# Patient Record
Sex: Male | Born: 1945
Health system: Southern US, Community
[De-identification: ages and names within clinical notes are randomized; demographics above are authoritative.]

## PROBLEM LIST (undated history)

## (undated) DIAGNOSIS — I252 Old myocardial infarction: Secondary | ICD-10-CM

## (undated) DIAGNOSIS — I219 Acute myocardial infarction, unspecified: Secondary | ICD-10-CM

## (undated) DIAGNOSIS — I10 Essential (primary) hypertension: Secondary | ICD-10-CM

## (undated) DIAGNOSIS — H409 Unspecified glaucoma: Secondary | ICD-10-CM

## (undated) DIAGNOSIS — I2510 Atherosclerotic heart disease of native coronary artery without angina pectoris: Secondary | ICD-10-CM

## (undated) HISTORY — PX: CORONARY STENT PLACEMENT: SHX1402

## (undated) HISTORY — PX: OTHER SURGICAL HISTORY: SHX169

---

## 1997-09-14 ENCOUNTER — Emergency Department (HOSPITAL_COMMUNITY): Admission: EM | Admit: 1997-09-14 | Discharge: 1997-09-14 | Payer: Self-pay | Admitting: Emergency Medicine

## 2005-07-22 ENCOUNTER — Inpatient Hospital Stay (HOSPITAL_COMMUNITY): Admission: EM | Admit: 2005-07-22 | Discharge: 2005-07-25 | Payer: Self-pay | Admitting: Emergency Medicine

## 2005-07-24 ENCOUNTER — Encounter (INDEPENDENT_AMBULATORY_CARE_PROVIDER_SITE_OTHER): Payer: Self-pay | Admitting: *Deleted

## 2005-08-06 ENCOUNTER — Encounter (HOSPITAL_COMMUNITY): Admission: RE | Admit: 2005-08-06 | Discharge: 2005-11-04 | Payer: Self-pay | Admitting: Cardiology

## 2005-11-05 ENCOUNTER — Encounter (HOSPITAL_COMMUNITY): Admission: RE | Admit: 2005-11-05 | Discharge: 2005-12-25 | Payer: Self-pay | Admitting: Cardiology

## 2006-01-26 ENCOUNTER — Emergency Department (HOSPITAL_COMMUNITY): Admission: EM | Admit: 2006-01-26 | Discharge: 2006-01-26 | Payer: Self-pay | Admitting: Emergency Medicine

## 2006-07-07 ENCOUNTER — Ambulatory Visit: Payer: Self-pay | Admitting: Cardiology

## 2006-11-04 ENCOUNTER — Ambulatory Visit: Payer: Self-pay | Admitting: Cardiology

## 2007-03-08 ENCOUNTER — Ambulatory Visit: Payer: Self-pay | Admitting: Cardiology

## 2007-07-20 ENCOUNTER — Ambulatory Visit: Payer: Self-pay | Admitting: Cardiology

## 2007-08-18 ENCOUNTER — Ambulatory Visit: Payer: Self-pay | Admitting: Cardiology

## 2007-08-18 LAB — CONVERTED CEMR LAB
BUN: 19 mg/dL (ref 6–23)
CO2: 29 meq/L (ref 19–32)
Calcium: 9.2 mg/dL (ref 8.4–10.5)
Creatinine, Ser: 1.1 mg/dL (ref 0.4–1.5)
GFR calc Af Amer: 87 mL/min
Glucose, Bld: 95 mg/dL (ref 70–99)

## 2007-11-08 ENCOUNTER — Ambulatory Visit: Payer: Self-pay | Admitting: Cardiology

## 2008-03-01 ENCOUNTER — Ambulatory Visit: Payer: Self-pay | Admitting: Cardiology

## 2008-07-10 DIAGNOSIS — R011 Cardiac murmur, unspecified: Secondary | ICD-10-CM

## 2008-07-10 DIAGNOSIS — I2510 Atherosclerotic heart disease of native coronary artery without angina pectoris: Secondary | ICD-10-CM | POA: Insufficient documentation

## 2008-07-10 DIAGNOSIS — E785 Hyperlipidemia, unspecified: Secondary | ICD-10-CM | POA: Insufficient documentation

## 2008-07-10 DIAGNOSIS — I10 Essential (primary) hypertension: Secondary | ICD-10-CM | POA: Insufficient documentation

## 2008-07-11 ENCOUNTER — Ambulatory Visit: Payer: Self-pay | Admitting: Cardiovascular Disease

## 2008-07-11 ENCOUNTER — Ambulatory Visit: Payer: Self-pay | Admitting: Cardiology

## 2008-11-22 ENCOUNTER — Ambulatory Visit: Payer: Self-pay | Admitting: Cardiology

## 2009-03-01 ENCOUNTER — Encounter (INDEPENDENT_AMBULATORY_CARE_PROVIDER_SITE_OTHER): Payer: Self-pay | Admitting: *Deleted

## 2009-03-19 ENCOUNTER — Ambulatory Visit: Payer: Self-pay | Admitting: Cardiology

## 2009-07-25 ENCOUNTER — Inpatient Hospital Stay (HOSPITAL_COMMUNITY): Admission: EM | Admit: 2009-07-25 | Discharge: 2009-08-03 | Payer: Self-pay | Admitting: Emergency Medicine

## 2009-07-26 ENCOUNTER — Encounter (INDEPENDENT_AMBULATORY_CARE_PROVIDER_SITE_OTHER): Payer: Self-pay | Admitting: Internal Medicine

## 2009-07-29 ENCOUNTER — Ambulatory Visit: Payer: Self-pay | Admitting: Infectious Diseases

## 2009-10-10 ENCOUNTER — Ambulatory Visit: Payer: Self-pay | Admitting: Cardiology

## 2009-11-22 ENCOUNTER — Ambulatory Visit: Payer: Self-pay | Admitting: Cardiology

## 2010-03-11 ENCOUNTER — Ambulatory Visit: Payer: Self-pay | Admitting: Cardiology

## 2010-03-18 ENCOUNTER — Ambulatory Visit: Payer: Self-pay | Admitting: Cardiology

## 2010-05-08 ENCOUNTER — Inpatient Hospital Stay (HOSPITAL_BASED_OUTPATIENT_CLINIC_OR_DEPARTMENT_OTHER)
Admission: RE | Admit: 2010-05-08 | Discharge: 2010-05-08 | Disposition: A | Discharge status: Home / Self Care | Payer: Medicare Other | Source: Ambulatory Visit | Attending: Interventional Cardiology | Admitting: Interventional Cardiology

## 2010-05-08 DIAGNOSIS — R9439 Abnormal result of other cardiovascular function study: Secondary | ICD-10-CM | POA: Insufficient documentation

## 2010-05-08 DIAGNOSIS — I2510 Atherosclerotic heart disease of native coronary artery without angina pectoris: Secondary | ICD-10-CM | POA: Insufficient documentation

## 2010-05-08 DIAGNOSIS — R0602 Shortness of breath: Secondary | ICD-10-CM | POA: Insufficient documentation

## 2010-05-08 DIAGNOSIS — Z9861 Coronary angioplasty status: Secondary | ICD-10-CM | POA: Insufficient documentation

## 2010-05-14 NOTE — Procedures (Signed)
NAME:  Mario [name], Mario [name] NO.:  [id number]  MEDICAL RECORD NO.:  [id number]            PATIENT TYPE:  LOCATION:                                 FACILITY:  PHYSICIAN:  Corky Crafts, MD     DATE OF BIRTH:  DATE OF PROCEDURE:  05/08/2010 DATE OF DISCHARGE:                           CARDIAC CATHETERIZATION   PROCEDURES PERFORMED: 1. Left heart catheterization. 2. Coronary angiogram. 3. Abdominal aortogram. 4. eft ventriculogram.  OPERATOR:  Corky Crafts, MD  INDICATIONS:  Abnormal stress test, persistent shortness of breath.  PROCEDURE NARRATIVE:  The risks and benefits of cardiac catheterization were explained to the patient.  Informed consent was obtained.  He was brought to the cath lab.  He was prepped and draped in usual sterile fashion.  His right groin was infiltrated with 1% lidocaine.  A 4-French sheath was placed in the right common femoral artery using modified Seldinger technique.  Left coronary artery angiography was performed using a JL-4.0 catheter.  Catheter was advanced to the vessel ostium under fluoroscopic guidance.  Digital angiography was performed in multiple projections using injection contrast.  Right coronary artery angiography was then performed using a JR-4.0 catheter in a similar fashion.  Catheter was advanced to the ascending aorta and across the aortic valve under fluoroscopic guidance.  Power injection of contrast performed in the RAO projection to image the left ventricle.  The catheter was pulled back under continuous hemodynamic pressure monitoring.  Catheter was then withdrawn through the abdominal aorta and a power injection of contrast was performed in AP projection.  The sheath was removed using manual compression.  FINDINGS:  The left main is widely patent.  Left circumflex is a large vessel.  The OM-1 is large.  The OM-2 is medium-sized.  The entire circumflex system appears  angiographically normal.  Left anterior descending has an ostial 25% stenosis.  There is a stent in the mid LAD, which appears patent.  There is approximately 10% restenosis in the midportion of the stent.  The remainder of the LAD is medium sized in the midportion and then small in the distal portion. The vessel does not wrap around the apex.  There are multiple small diagonals present all of which are patent.  The right coronary artery is a large dominant vessel, which appears angiographically normal.  The PDA reaches the apex.  The posterolateral artery is a medium-sized vessel.  Left ventriculogram shows normal ventricular function with an estimated ejection fraction of 60%.  HEMODYNAMIC RESULTS:  Left ventricular pressure 120/40 with an LVEDP of 15 mmHg.  Aortic pressure 120/69 with a mean aortic pressure of 92 mmHg.  The abdominal aortogram shows no abdominal aortic aneurysm.  There is no renal artery stenosis or bilateral single renal artery.  IMPRESSION: 1. Patent left anterior descending stent moderate atherosclerosis in     the left anterior descending when comparing these pictures to the     prior films.  The ostial left anterior descending disease appears     stable. 2. Normal left ventricular function. 3. No abdominal aortic aneurysm or renal artery stenosis.  4. Normal hemodynamics.  RECOMMENDATIONS:  Continue medical therapy.  The patient should try to increase his exercise regimen as he was planning to do.     Corky Crafts, MD     JSV/MEDQ  D:  05/08/2010  T:  05/09/2010  Job:  [id number]  Electronically Signed by Lance Muss MD on 05/14/2010 09:51:16 AM

## 2010-06-17 LAB — COMPREHENSIVE METABOLIC PANEL
ALT: 26 U/L (ref 0–53)
ALT: 13 U/L (ref 0–53)
AST: 39 U/L — ABNORMAL HIGH (ref 0–37)
Albumin: 2.7 g/dL — ABNORMAL LOW (ref 3.5–5.2)
Alkaline Phosphatase: 36 U/L — ABNORMAL LOW (ref 39–117)
CO2: 25 mEq/L (ref 19–32)
Calcium: 8.3 mg/dL — ABNORMAL LOW (ref 8.4–10.5)
Calcium: 7.5 mg/dL — ABNORMAL LOW (ref 8.4–10.5)
Creatinine, Ser: 1.04 mg/dL (ref 0.4–1.5)
GFR calc Af Amer: >60 mL/min (ref >60)
GFR calc non Af Amer: >60 mL/min (ref >60)
Potassium: 4.4 mEq/L (ref 3.5–5.1)
Sodium: 142 mEq/L (ref 135–145)
Sodium: 135 mEq/L (ref 135–145)
Total Protein: 6.2 g/dL (ref 6.0–8.3)
Total Protein: 5.2 g/dL — ABNORMAL LOW (ref 6.0–8.3)

## 2010-06-17 LAB — CBC
HCT: 33.1 % — ABNORMAL LOW (ref 39.0–52.0)
HCT: 35.5 % — ABNORMAL LOW (ref 39.0–52.0)
Hemoglobin: 11.6 g/dL — ABNORMAL LOW (ref 13.0–17.0)
Hemoglobin: 12.3 g/dL — ABNORMAL LOW (ref 13.0–17.0)
Hemoglobin: 12.4 g/dL — ABNORMAL LOW (ref 13.0–17.0)
MCHC: 33.8 g/dL (ref 30.0–36.0)
MCHC: 34.3 g/dL (ref 30.0–36.0)
MCHC: 34.3 g/dL (ref 30.0–36.0)
MCHC: 34.4 g/dL (ref 30.0–36.0)
MCHC: 35 g/dL (ref 30.0–36.0)
MCV: 88.7 fL (ref 78.0–100.0)
MCV: 89 fL (ref 78.0–100.0)
MCV: 89 fL (ref 78.0–100.0)
MCV: 89.1 fL (ref 78.0–100.0)
MCV: 89.3 fL (ref 78.0–100.0)
MCV: 89.5 fL (ref 78.0–100.0)
MCV: 89.9 fL (ref 78.0–100.0)
MCV: 89.9 fL (ref 78.0–100.0)
Platelets: 202 10*3/uL (ref 150–400)
Platelets: 220 10*3/uL (ref 150–400)
Platelets: 287 10*3/uL (ref 150–400)
Platelets: 157 10*3/uL (ref 150–400)
Platelets: 97 10*3/uL — ABNORMAL LOW (ref 150–400)
Platelets: DECREASED 10*3/uL (ref 150–400)
RBC: 4.05 MIL/uL — ABNORMAL LOW (ref 4.22–5.81)
RBC: 4.07 MIL/uL — ABNORMAL LOW (ref 4.22–5.81)
RBC: 4.13 MIL/uL — ABNORMAL LOW (ref 4.22–5.81)
RBC: 3.66 MIL/uL — ABNORMAL LOW (ref 4.22–5.81)
RDW: 13.4 % (ref 11.5–15.5)
RDW: 13.6 % (ref 11.5–15.5)
RDW: 13.4 % (ref 11.5–15.5)
RDW: 13.5 % (ref 11.5–15.5)
WBC: 11.8 10*3/uL — ABNORMAL HIGH (ref 4.0–10.5)
WBC: 12.8 10*3/uL — ABNORMAL HIGH (ref 4.0–10.5)
WBC: 13.6 10*3/uL — ABNORMAL HIGH (ref 4.0–10.5)
WBC: 14 10*3/uL — ABNORMAL HIGH (ref 4.0–10.5)

## 2010-06-17 LAB — BASIC METABOLIC PANEL
BUN: 10 mg/dL (ref 6–23)
BUN: 10 mg/dL (ref 6–23)
BUN: 11 mg/dL (ref 6–23)
BUN: 14 mg/dL (ref 6–23)
CO2: 25 mEq/L (ref 19–32)
Calcium: 8.5 mg/dL (ref 8.4–10.5)
Calcium: 7.9 mg/dL — ABNORMAL LOW (ref 8.4–10.5)
Chloride: 106 mEq/L (ref 96–112)
Chloride: 107 mEq/L (ref 96–112)
Chloride: 109 mEq/L (ref 96–112)
Chloride: 114 mEq/L — ABNORMAL HIGH (ref 96–112)
Creatinine, Ser: 1.04 mg/dL (ref 0.4–1.5)
Creatinine, Ser: 1.16 mg/dL (ref 0.4–1.5)
Creatinine, Ser: 1.18 mg/dL (ref 0.4–1.5)
Creatinine, Ser: 1.16 mg/dL (ref 0.4–1.5)
Creatinine, Ser: 1.21 mg/dL (ref 0.4–1.5)
GFR calc Af Amer: >60 mL/min (ref >60)
GFR calc Af Amer: >60 mL/min (ref >60)
GFR calc Af Amer: >60 mL/min (ref >60)
GFR calc Af Amer: >60 mL/min (ref >60)
GFR calc non Af Amer: >60 mL/min (ref >60)
GFR calc non Af Amer: >60 mL/min (ref >60)
GFR calc non Af Amer: >60 mL/min (ref >60)
Glucose, Bld: 85 mg/dL (ref 70–99)
Glucose, Bld: 89 mg/dL (ref 70–99)
Glucose, Bld: 96 mg/dL (ref 70–99)
Potassium: 3.4 mEq/L — ABNORMAL LOW (ref 3.5–5.1)
Potassium: 3.8 mEq/L (ref 3.5–5.1)
Sodium: 140 mEq/L (ref 135–145)
Sodium: 140 mEq/L (ref 135–145)

## 2010-06-17 LAB — POCT I-STAT, CHEM 8
BUN: 15 mg/dL (ref 6–23)
Creatinine, Ser: 1.2 mg/dL (ref 0.4–1.5)
Hemoglobin: 14.6 g/dL (ref 13.0–17.0)
Potassium: 3.7 mEq/L (ref 3.5–5.1)
Sodium: 139 mEq/L (ref 135–145)

## 2010-06-17 LAB — DIFFERENTIAL
Basophils Relative: 0 % (ref 0–1)
Eosinophils Absolute: 0 10*3/uL (ref 0.0–0.7)
Eosinophils Absolute: 0 10*3/uL (ref 0.0–0.7)
Eosinophils Relative: 0 % (ref 0–5)
Lymphocytes Relative: 12 % (ref 12–46)
Lymphocytes Relative: 8 % — ABNORMAL LOW (ref 12–46)
Lymphs Abs: 1.7 10*3/uL (ref 0.7–4.0)
Neutro Abs: 11.4 10*3/uL — ABNORMAL HIGH (ref 1.7–7.7)
Neutro Abs: 18.4 10*3/uL — ABNORMAL HIGH (ref 1.7–7.7)
Neutrophils Relative %: 81 % — ABNORMAL HIGH (ref 43–77)

## 2010-06-17 LAB — AFB CULTURE WITH SMEAR (NOT AT ARMC): Acid Fast Smear: NONE SEEN

## 2010-06-17 LAB — SEDIMENTATION RATE: Sed Rate: 3 mm/hr (ref 0–16)

## 2010-06-17 LAB — WOUND CULTURE: Gram Stain: NONE SEEN

## 2010-06-17 LAB — CULTURE, BLOOD (ROUTINE X 2)
Culture: NO GROWTH
Culture: NO GROWTH

## 2010-06-17 LAB — HEPATIC FUNCTION PANEL
AST: 22 U/L (ref 0–37)
Albumin: 3 g/dL — ABNORMAL LOW (ref 3.5–5.2)
Total Bilirubin: 2.3 mg/dL — ABNORMAL HIGH (ref 0.3–1.2)
Total Protein: 5.8 g/dL — ABNORMAL LOW (ref 6.0–8.3)

## 2010-06-17 LAB — DIC (DISSEMINATED INTRAVASCULAR COAGULATION)PANEL
Fibrinogen: 644 mg/dL — ABNORMAL HIGH (ref 204–475)
Platelets: 97 10*3/uL — ABNORMAL LOW (ref 150–400)
Prothrombin Time: 15.6 seconds — ABNORMAL HIGH (ref 11.6–15.2)
aPTT: 36 seconds (ref 24–37)

## 2010-06-17 LAB — D-DIMER, QUANTITATIVE: D-Dimer, Quant: 0.71 ug/mL-FEU — ABNORMAL HIGH (ref 0.00–0.48)

## 2010-06-17 LAB — POCT CARDIAC MARKERS
CKMB, poc: <1 ng/mL — ABNORMAL LOW (ref 1.0–8.0)
Myoglobin, poc: 102 ng/mL (ref 12–200)
Troponin i, poc: <0.05 ng/mL (ref 0.00–0.09)

## 2010-06-17 LAB — CK TOTAL AND CKMB (NOT AT ARMC): Total CK: 129 U/L (ref 7–232)

## 2010-06-17 LAB — VANCOMYCIN, TROUGH: Vancomycin Tr: 13.7 ug/mL (ref 10.0–20.0)

## 2010-06-17 LAB — ANAEROBIC CULTURE

## 2010-07-10 ENCOUNTER — Encounter (INDEPENDENT_AMBULATORY_CARE_PROVIDER_SITE_OTHER): Payer: Medicare Other

## 2010-07-10 DIAGNOSIS — R0989 Other specified symptoms and signs involving the circulatory and respiratory systems: Secondary | ICD-10-CM

## 2010-08-15 NOTE — Cardiovascular Report (Signed)
NAME:  Mario [name], HUBBLE NO.:  [id number]   MEDICAL RECORD NO.:  [id number]          PATIENT TYPE:  INP   LOCATION:  2807                         FACILITY:  [facility]   PHYSICIAN:  Corky Crafts, MDDATE OF BIRTH:  Jan 16, 1946   DATE OF PROCEDURE:  07/22/2005  DATE OF DISCHARGE:                              CARDIAC CATHETERIZATION   PRIMARY PHYSICIAN:  Dr. Lajean Manes   PROCEDURES PERFORMED:  1.  Left heart catheterization.  2.  Left ventriculogram.  3.  Coronary angiogram.  4.  PCI of the left anterior descending.   OPERATOR:  Dr. Eldridge Dace   INDICATIONS:  Anterior ST elevation myocardial infarction.   PROCEDURE:  The risks and benefits of cardiac catheterization were quickly  explained to the patient and informed consent was obtained.  He was  emergently taken to the cardiac catheterization laboratory because of his ST  elevation MI by EKG.  A 6-French arterial sheath was placed in his right  femoral artery using the modified Seldinger technique.  A JR4 catheter was  then advanced to the ostium of the right coronary artery and digital  angiography was performed using hand injection of contrast.  A CLS3.5  guiding catheter was then advanced to the ostium of the left main under  fluoroscopic guidance.  Digital angiography was performed in multiple  projections using hand injection of contrast.  The PCI was then performed.  See below for details.  A pigtail catheter was then advanced into the  ascending aorta after the PCI.  It was advanced across the aortic valve and  into the left ventricle.  30 degree RAO left ventriculogram was then  performed.  The pigtail catheter was pulled back across the aortic valve  under continuous hemodynamic monitoring.  The sheath will be removed using  manual compression.   FINDINGS:  The right coronary artery is a large dominant vessel.  There are  faint right-to-left collaterals noted.  The left main was widely patent.  Left circumflex is a large vessel with luminal irregularities.  The first  obtuse marginal is a large vessel with luminal irregularities.  The OM2 is a  medium sized vessel with luminal irregularities.  The left anterior  descending is a large vessel.  There is an ostial 20% lesion in the proximal  portion of the LAD at the first septal perforator.  There is a hazy 99%  lesion which appears to be thrombus.  There are luminal irregularities  throughout the remainder of the vessel including a 30% mid vessel lesion.  The first diagonal takes off at the lesion in the LAD.  It is a small  vessel.  D2 and D3 are also small vessels which are patent.   PERCUTANEOUS CORONARY INTERVENTION:  A CLS3.5 guiding catheter was advanced  to the ostium of the left main.  The lesion in the LAD was crossed with a  Prowater wire.  An Export catheter was advanced to the lesion and two  attempts were made at aspiration thrombectomy.  There was no successful  thrombus removal.  A 3 x 18 Vision stent  was then advanced across the  lesion.  It was deployed at 15 atmospheres for 30 seconds.  The stent was  then post dilated with a 3.5 x 13 mm PowerSail balloon to 12 atmospheres in  the proximal portion of the stent and 12 atmospheres in the distal portion  of the stent.  There was TIMI 3 flow noted and a 0% residual stenosis.  There was shift of thrombus into both the septal and small diagonals coming  off at the lesion.  However, both vessels had flow in them at the end of the  procedure.  Left ventriculogram showed mild anterior hypokinesis.  Overall  left ventricular ejection fraction of 55-60%.   HEMODYNAMICS:  Left ventricular pressure 112/6 with an LVEDP of 12 mmHg.  Aortic blood pressure 122/73 with a mean aortic pressure of 95 mmHg.   IMPRESSION:  1.  Acute anterolateral myocardial infarction secondary to occluded proximal      left anterior descending.  2.  Successful stent placement to the proximal left  anterior descending with      a 3 x 18 mm Vision stent which was subsequently post dilated to 3.5 mm.  3.  Overall preserved left ventricular function.   RECOMMENDATIONS:  Patient should continue aspirin 325 mg p.o. daily, Plavix  75 mg p.o. daily.  He will receive Integrilin for 18 hours.  He will be  monitored in the CCU.  Will start a beta blocker as well as a statin.  We  will eventually continue the patient's Diovan.      Corky Crafts, MD  Electronically Signed     JSV/MEDQ  D:  07/22/2005  T:  07/23/2005  Job:  [id number]

## 2010-08-15 NOTE — Discharge Summary (Signed)
NAME:  Mario [name], Mario [name] NO.:  [id number]   MEDICAL RECORD NO.:  [id number]          PATIENT TYPE:  INP   LOCATION:  3715                         FACILITY:  [facility]   PHYSICIAN:  Armanda Magic, M.D.     DATE OF BIRTH:  March 16, 1946   DATE OF ADMISSION:  07/22/2005  DATE OF DISCHARGE:  07/25/2005                                 DISCHARGE SUMMARY   DISCHARGE DIAGNOSES:  1.  Acute anterior myocardial infarction, ST segment elevated myocardial      infarction involving the left anterior descending artery, status post      percutaneous coronary intervention to this area.  2.  Normal ejection fraction.  3.  Hypertension, treated.  4.  Hyperlipidemia, medications have been started.  5.  Systolic murmur with echo showing trivial mitral regurgitation, left      ventricular systolic function was normal.  There was also moderate      hypokinesis of his apical wall.  6.  Long-term medication use.   HOSPITAL COURSE:  Mario [name] is a 65 year old male patient who was digging a  ditch with his son and began feeling overheated.  Next, he complained of  fatigue and extreme diaphoresis.  Chest pain followed and he was brought  quickly to the emergency room because he was working around the hospital.  A  12-lead EKG in the emergency room showed an acute anterior lateral  myocardial infarction.  He was taken emergently to the cardiac  catheterization lab and underwent a multi-length Vision stent placement to  the LAD reducing the 99% proximal lesion to a 0% residual stenosis with TIMI  III flow.  The patient also had in the other vessels:  Circumflex was large  with luminal irregularities, OM large with luminal irregularities, OM-2  medium size with luminal irregularities.  Coronary artery was large.  It was  the dominant vessel.  There were faint right to left collaterals.  LV gram  showed a mild anterior hypokinesis with an EF of 60%.  The patient tolerated  the procedure well and will  need to remain on aspirin and Plavix and  Integrilin.   Gradually, over the next 24 hours, the Integrilin was stopped.  Because of  his known hypertension, beta blocker was increased especially under the  circumstances of acute myocardial infarction.  He was also started on  cholesterol lowering medications and in fact met the criteria for the  IMPROVE IT study.   Through his hospitalization, he was recommended for cardiac rehab phase 1  and phase 2.   Laboratory studies during the patient's hospital stay included white count  7.5, hemoglobin 12.8, hematocrit 73.5, platelets 186,000.  Sodium 140,  potassium 3.9, BUN 13, creatinine 1.1, maximum CK 216 with a MB fraction of  19.8.  His troponin was 1.63.  Total cholesterol 171, triglycerides 189, LDL  99, HDL 34.   On July 25, 2005, the patient had nonspecific ST/T-wave changes of the  inferior leads but had not developed Q-waves in the anterior lateral leads.  He remained in normal sinus rhythm with a rate of  69.  The patient is  discharged home in stable condition on the following medications:  Enteric-  coated aspirin 325 mg daily, Plavix 75 mg daily, Lopressor 25 mg b.i.d.,  Diovan 80 mg daily, sublingual nitroglycerin p.r.n. chest pain.  He has been  started on the IMPROVE IT study drug and has been instructed how to take  this drug through the research department.  He may  clean his catheter site gently with soap and water.  No driving for two  days.  No lifting over 10 pounds for one week.  He is not to go back to work  until released by Dr. Amil Amen especially due to the fact that he works in  such a physical labor job.  He is to follow up with Dr. Amil Amen on Aug 07, 2005 at 3 p.m.      Guy Franco, P.A.      Armanda Magic, M.D.  Electronically Signed    LB/MEDQ  D:  08/31/2005  T:  08/31/2005  Job:  [id number]   cc:   Francisca December, M.D.  Fax: [phone]   Oley Balm. Georgina Pillion, M.D.  Fax: [phone]

## 2010-08-15 NOTE — H&P (Signed)
NAME:  Mario [name], Mario [name] NO.:  [id number]   MEDICAL RECORD NO.:  [id number]          PATIENT TYPE:  INP   LOCATION:  1825                         FACILITY:  [facility]   PHYSICIAN:  Corky Crafts, MDDATE OF BIRTH:  08-Mar-1946   DATE OF ADMISSION:  07/22/2005  DATE OF DISCHARGE:                                HISTORY & PHYSICAL   PRIMARY CARE PHYSICIAN:  Dr. Lajean Manes   CARDIOLOGIST:  Corliss Marcus, M.D.   CHIEF COMPLAINT:  Chest pain.   HISTORY OF PRESENT ILLNESS:  Mr. Mario [name] is a 65 year old Caucasian male who  was digging a ditch with his son and became over heated.  He decided to sit  down for about 20 minutes.  He complained of fatigue and became severely  diaphoretic and complained of chest pressure and dizziness.  He denied  nausea, vomiting, and syncope.  He was driven to the [facility] Emergency  Department by his son.  Upon arrival to the emergency department a 12-lead  EKG was obtained and revealed 3-4 mm ST segment elevation in the  anterolateral leads.  The patient was given aspirin 81 mg x4.  An IV  nitroglycerin drip was started at 10 mcg per minute.  The patient was  transferred to the catheterization laboratory and an urgent cardiac  catheterization was performed by Dr. Everette Rank.   PAST MEDICAL HISTORY:  Hypertension.   ALLERGIES:  AZITHROMYCIN.   MEDICATIONS:  Diovan, dosage unknown.   FAMILY HISTORY:  Noncontributory.   SOCIAL HISTORY:  The patient is married with at least one son.  He denies  tobacco, alcohol, or illicit drug use.   REVIEW OF SYSTEMS:  All other systems reviewed are negative other than what  is stated in the HPI.   PHYSICAL EXAMINATION:  GENERAL:  65 year old male, diaphoretic, pale who  appeared in acute distress.  VITAL SIGNS:  Obtained in the catheterization laboratory:  Pulse 88,  respirations 15, blood pressure 154/90, O2 saturations 98% over 2 L.  Physical examination is limited due to the urgent  nature.  HEART:  Regular rate and rhythm.  S1 and S2 normal.  No murmurs, gallops,  clicks, or rubs.  LUNGS:  Clear to auscultation bilaterally.  EXTREMITIES:  Femoral pulses are present bilaterally.  No femoral bruits  were auscultated.  No peripheral edema.  DP pulses 2+/2 bilaterally.   LABORATORY DATA:  Sodium 141, potassium 3.3, chloride 110, CO2 25.1, BUN 17,  creatinine 1.2, glucose 123.  PT 12.4, INR 0.9, PTT 26.  White blood count  11.4, hemoglobin 13.9, hematocrit 40.5%, platelets 228.  Point of care  cardiac markers x1:  Myoglobin 90, CK-MB 1.1, troponin I less than 0.05.   ASSESSMENT:  1.  Acute ST segment elevation myocardial infarction in the anterolateral      leads.  2.  Hypertension.  3.  Hypokalemia.  4.  Mild Leukocytosis.   PLAN:  1.  Urgent cardiac catheterization.  The patient will need therapy with      aspirin, a statin and a beta blocker as well.  The patient expressed  concern about receiving a stent.  Given this, we will plan on using a      bare metal stent if that is possible and becomes necessary.  2.  Replete potassium after urgent cardiac catheterization with K-Dur 60 mEq      p.o. now.    The history and physical was abbreviated because of the urgency of the  catheterization.  THe H&P and assessment and plan was performed by Dr.  Eldridge Dace.      [address] [place], [place]      Corky Crafts, MD  Electronically Signed    RDM/MEDQ  D:  07/22/2005  T:  07/22/2005  Job:  [id number]   cc:   Oley Balm. Georgina Pillion, M.D.  Fax: [phone]

## 2011-04-22 DIAGNOSIS — H6120 Impacted cerumen, unspecified ear: Secondary | ICD-10-CM | POA: Diagnosis not present

## 2011-04-22 DIAGNOSIS — H6500 Acute serous otitis media, unspecified ear: Secondary | ICD-10-CM | POA: Diagnosis not present

## 2011-07-08 DIAGNOSIS — E7800 Pure hypercholesterolemia, unspecified: Secondary | ICD-10-CM | POA: Diagnosis not present

## 2011-07-08 DIAGNOSIS — I10 Essential (primary) hypertension: Secondary | ICD-10-CM | POA: Diagnosis not present

## 2011-07-08 DIAGNOSIS — I2510 Atherosclerotic heart disease of native coronary artery without angina pectoris: Secondary | ICD-10-CM | POA: Diagnosis not present

## 2011-07-23 ENCOUNTER — Encounter (INDEPENDENT_AMBULATORY_CARE_PROVIDER_SITE_OTHER): Payer: Medicare Other

## 2011-07-23 DIAGNOSIS — R0989 Other specified symptoms and signs involving the circulatory and respiratory systems: Secondary | ICD-10-CM

## 2011-09-02 DIAGNOSIS — I10 Essential (primary) hypertension: Secondary | ICD-10-CM | POA: Diagnosis not present

## 2011-09-02 DIAGNOSIS — E291 Testicular hypofunction: Secondary | ICD-10-CM | POA: Diagnosis not present

## 2011-12-03 DIAGNOSIS — H4011X0 Primary open-angle glaucoma, stage unspecified: Secondary | ICD-10-CM | POA: Diagnosis not present

## 2011-12-10 DIAGNOSIS — H35039 Hypertensive retinopathy, unspecified eye: Secondary | ICD-10-CM | POA: Diagnosis not present

## 2011-12-10 DIAGNOSIS — H2510 Age-related nuclear cataract, unspecified eye: Secondary | ICD-10-CM | POA: Diagnosis not present

## 2011-12-16 DIAGNOSIS — H21239 Degeneration of iris (pigmentary), unspecified eye: Secondary | ICD-10-CM | POA: Diagnosis not present

## 2011-12-16 DIAGNOSIS — H401390 Pigmentary glaucoma, unspecified eye, stage unspecified: Secondary | ICD-10-CM | POA: Diagnosis not present

## 2011-12-16 DIAGNOSIS — H4011X0 Primary open-angle glaucoma, stage unspecified: Secondary | ICD-10-CM | POA: Diagnosis not present

## 2011-12-30 DIAGNOSIS — H35039 Hypertensive retinopathy, unspecified eye: Secondary | ICD-10-CM | POA: Diagnosis not present

## 2011-12-30 DIAGNOSIS — H2510 Age-related nuclear cataract, unspecified eye: Secondary | ICD-10-CM | POA: Diagnosis not present

## 2011-12-30 DIAGNOSIS — H4011X0 Primary open-angle glaucoma, stage unspecified: Secondary | ICD-10-CM | POA: Diagnosis not present

## 2011-12-30 DIAGNOSIS — H40059 Ocular hypertension, unspecified eye: Secondary | ICD-10-CM | POA: Diagnosis not present

## 2011-12-30 DIAGNOSIS — H409 Unspecified glaucoma: Secondary | ICD-10-CM | POA: Diagnosis not present

## 2012-02-16 DIAGNOSIS — H401390 Pigmentary glaucoma, unspecified eye, stage unspecified: Secondary | ICD-10-CM | POA: Diagnosis not present

## 2012-02-29 DIAGNOSIS — Z1331 Encounter for screening for depression: Secondary | ICD-10-CM | POA: Diagnosis not present

## 2012-02-29 DIAGNOSIS — Z125 Encounter for screening for malignant neoplasm of prostate: Secondary | ICD-10-CM | POA: Diagnosis not present

## 2012-02-29 DIAGNOSIS — Z0000 Encounter for general adult medical examination without abnormal findings: Secondary | ICD-10-CM | POA: Diagnosis not present

## 2012-02-29 DIAGNOSIS — I10 Essential (primary) hypertension: Secondary | ICD-10-CM | POA: Diagnosis not present

## 2012-02-29 DIAGNOSIS — E7800 Pure hypercholesterolemia, unspecified: Secondary | ICD-10-CM | POA: Diagnosis not present

## 2012-02-29 DIAGNOSIS — Z23 Encounter for immunization: Secondary | ICD-10-CM | POA: Diagnosis not present

## 2012-04-29 DIAGNOSIS — L738 Other specified follicular disorders: Secondary | ICD-10-CM | POA: Diagnosis not present

## 2012-04-30 DIAGNOSIS — B9789 Other viral agents as the cause of diseases classified elsewhere: Secondary | ICD-10-CM | POA: Diagnosis not present

## 2012-05-19 DIAGNOSIS — H409 Unspecified glaucoma: Secondary | ICD-10-CM | POA: Diagnosis not present

## 2012-05-19 DIAGNOSIS — H401390 Pigmentary glaucoma, unspecified eye, stage unspecified: Secondary | ICD-10-CM | POA: Diagnosis not present

## 2012-06-16 DIAGNOSIS — H409 Unspecified glaucoma: Secondary | ICD-10-CM | POA: Diagnosis not present

## 2012-06-16 DIAGNOSIS — H401390 Pigmentary glaucoma, unspecified eye, stage unspecified: Secondary | ICD-10-CM | POA: Diagnosis not present

## 2012-07-12 DIAGNOSIS — I10 Essential (primary) hypertension: Secondary | ICD-10-CM | POA: Diagnosis not present

## 2012-07-12 DIAGNOSIS — I2510 Atherosclerotic heart disease of native coronary artery without angina pectoris: Secondary | ICD-10-CM | POA: Diagnosis not present

## 2012-07-12 DIAGNOSIS — E7800 Pure hypercholesterolemia, unspecified: Secondary | ICD-10-CM | POA: Diagnosis not present

## 2012-07-13 ENCOUNTER — Encounter (INDEPENDENT_AMBULATORY_CARE_PROVIDER_SITE_OTHER): Payer: Medicare Other

## 2012-07-13 DIAGNOSIS — R0989 Other specified symptoms and signs involving the circulatory and respiratory systems: Secondary | ICD-10-CM

## 2012-07-19 DIAGNOSIS — H21239 Degeneration of iris (pigmentary), unspecified eye: Secondary | ICD-10-CM | POA: Diagnosis not present

## 2012-07-19 DIAGNOSIS — H401390 Pigmentary glaucoma, unspecified eye, stage unspecified: Secondary | ICD-10-CM | POA: Diagnosis not present

## 2012-07-19 DIAGNOSIS — H04129 Dry eye syndrome of unspecified lacrimal gland: Secondary | ICD-10-CM | POA: Diagnosis not present

## 2012-08-08 ENCOUNTER — Encounter (HOSPITAL_COMMUNITY): Payer: Self-pay | Admitting: Emergency Medicine

## 2012-08-08 ENCOUNTER — Observation Stay (HOSPITAL_COMMUNITY)
Admission: EM | Admit: 2012-08-08 | Discharge: 2012-08-09 | Disposition: A | Discharge status: Home / Self Care | Payer: Medicare Other | Attending: Interventional Cardiology | Admitting: Interventional Cardiology

## 2012-08-08 ENCOUNTER — Emergency Department (HOSPITAL_COMMUNITY): Payer: Medicare Other

## 2012-08-08 DIAGNOSIS — I10 Essential (primary) hypertension: Secondary | ICD-10-CM | POA: Diagnosis not present

## 2012-08-08 DIAGNOSIS — I2510 Atherosclerotic heart disease of native coronary artery without angina pectoris: Principal | ICD-10-CM | POA: Insufficient documentation

## 2012-08-08 DIAGNOSIS — R079 Chest pain, unspecified: Secondary | ICD-10-CM | POA: Diagnosis not present

## 2012-08-08 DIAGNOSIS — I252 Old myocardial infarction: Secondary | ICD-10-CM | POA: Diagnosis not present

## 2012-08-08 DIAGNOSIS — R42 Dizziness and giddiness: Secondary | ICD-10-CM | POA: Diagnosis not present

## 2012-08-08 DIAGNOSIS — Z79899 Other long term (current) drug therapy: Secondary | ICD-10-CM | POA: Diagnosis not present

## 2012-08-08 DIAGNOSIS — R5381 Other malaise: Secondary | ICD-10-CM | POA: Diagnosis not present

## 2012-08-08 DIAGNOSIS — E860 Dehydration: Secondary | ICD-10-CM | POA: Insufficient documentation

## 2012-08-08 DIAGNOSIS — M25519 Pain in unspecified shoulder: Secondary | ICD-10-CM | POA: Insufficient documentation

## 2012-08-08 DIAGNOSIS — R0602 Shortness of breath: Secondary | ICD-10-CM | POA: Diagnosis not present

## 2012-08-08 DIAGNOSIS — Z9861 Coronary angioplasty status: Secondary | ICD-10-CM | POA: Diagnosis not present

## 2012-08-08 DIAGNOSIS — E869 Volume depletion, unspecified: Secondary | ICD-10-CM | POA: Insufficient documentation

## 2012-08-08 DIAGNOSIS — I259 Chronic ischemic heart disease, unspecified: Secondary | ICD-10-CM | POA: Diagnosis not present

## 2012-08-08 HISTORY — DX: Acute myocardial infarction, unspecified: I21.9

## 2012-08-08 HISTORY — DX: Old myocardial infarction: I25.2

## 2012-08-08 HISTORY — DX: Essential (primary) hypertension: I10

## 2012-08-08 HISTORY — DX: Atherosclerotic heart disease of native coronary artery without angina pectoris: I25.10

## 2012-08-08 LAB — URINALYSIS, ROUTINE W REFLEX MICROSCOPIC
Glucose, UA: NEGATIVE mg/dL
Leukocytes, UA: NEGATIVE
pH: 5.5 (ref 5.0–8.0)

## 2012-08-08 LAB — CBC
HCT: 42.5 % (ref 39.0–52.0)
Hemoglobin: 15 g/dL (ref 13.0–17.0)
MCHC: 35.3 g/dL (ref 30.0–36.0)
WBC: 10.7 10*3/uL — ABNORMAL HIGH (ref 4.0–10.5)

## 2012-08-08 LAB — BASIC METABOLIC PANEL
BUN: 20 mg/dL (ref 6–23)
CO2: 23 mEq/L (ref 19–32)
Chloride: 104 mEq/L (ref 96–112)
Glucose, Bld: 119 mg/dL — ABNORMAL HIGH (ref 70–99)
Potassium: 3.8 mEq/L (ref 3.5–5.1)

## 2012-08-08 LAB — URINE MICROSCOPIC-ADD ON

## 2012-08-08 LAB — D-DIMER, QUANTITATIVE: D-Dimer, Quant: 0.35 ug/mL-FEU (ref 0.00–0.48)

## 2012-08-08 MED ORDER — NITROGLYCERIN 0.4 MG SL SUBL
0.4000 mg | SUBLINGUAL_TABLET | SUBLINGUAL | Status: DC | PRN
  Administered 2012-08-08 (×2): 0.4 mg via SUBLINGUAL
  Filled 2012-08-08: qty 25

## 2012-08-08 MED ORDER — SODIUM CHLORIDE 0.9 % IV BOLUS (SEPSIS)
1000.0000 mL | Freq: Once | INTRAVENOUS | Status: AC
  Administered 2012-08-08: 1000 mL via INTRAVENOUS

## 2012-08-08 MED ORDER — IOHEXOL 350 MG/ML SOLN
100.0000 mL | Freq: Once | INTRAVENOUS | Status: AC | PRN
  Administered 2012-08-08: 100 mL via INTRAVENOUS

## 2012-08-08 NOTE — ED Provider Notes (Signed)
History     CSN: [id number]  Arrival date & time 08/08/12  2002   First MD Initiated Contact with Patient 08/08/12 2040      Chief Complaint  Patient presents with  . Chest Pain    (Consider location/radiation/quality/duration/timing/severity/associated sxs/prior treatment) HPI Comments: Pt comes in with cc of chest pain. PT has hx of CAD, stents to LAD 7 years ago. Pt states that today around 4 pm, he started having some left sided subscapular dull pain, similar to his MI.. The pain is nor pleuritic, exertional. He has no associated dib, nausea, wife reports patient being slightly clammy at some point. Pt is noted to be tachycardic. He has no smoking hx, substance abuse hx and deneis any dizziness, syncope. No risk factors for PE, DVT, dissection.   Patient is a 67 y.o. male presenting with chest pain. The history is provided by the patient.  Chest Pain Associated symptoms: dizziness   Associated symptoms: no cough, no fever, no headache and no shortness of breath     Past Medical History  Diagnosis Date  . Coronary artery disease   . Myocardial infarct     Past Surgical History  Procedure Laterality Date  . Coronary stent placement      History reviewed. No pertinent family history.  History  Substance Use Topics  . Smoking status: Never Smoker   . Smokeless tobacco: Not on file  . Alcohol Use: No      Review of Systems  Constitutional: Negative for fever, chills and activity change.  HENT: Negative for neck pain.   Eyes: Negative for visual disturbance.  Respiratory: Negative for cough, chest tightness and shortness of breath.   Cardiovascular: Positive for chest pain.  Gastrointestinal: Negative for abdominal distention.  Genitourinary: Negative for dysuria, enuresis and difficulty urinating.  Musculoskeletal: Negative for arthralgias.  Skin: Negative for rash.  Neurological: Positive for dizziness. Negative for light-headedness and headaches.   Psychiatric/Behavioral: Negative for confusion.    Allergies  Zicam cold remedy  Home Medications   Current Outpatient Rx  Name  Route  Sig  Dispense  Refill  . aspirin 325 MG tablet   Oral   Take 325 mg by mouth every morning.         . metoprolol tartrate (LOPRESSOR) 25 MG tablet   Oral   Take 25 mg by mouth 2 (two) times daily.         . STUDY MEDICATION   Oral   Take 1 tablet by mouth every evening. Study through [place] (for cholesterol)         . valsartan (DIOVAN) 160 MG tablet   Oral   Take 160 mg by mouth every morning.           BP 128/78  Pulse 115  Temp(Src) 99.6 F (37.6 C) (Oral)  Resp 21  SpO2 100%  Physical Exam  Nursing note and vitals reviewed. Constitutional: He is oriented to person, place, and time. He appears well-developed.  HENT:  Head: Normocephalic and atraumatic.  Eyes: Conjunctivae and EOM are normal. Pupils are equal, round, and reactive to light.  Neck: Normal range of motion. Neck supple.  Cardiovascular: Regular rhythm and intact distal pulses.   Pulmonary/Chest: Effort normal and breath sounds normal. No respiratory distress. He has no wheezes. He has no rales. He exhibits no tenderness.  Abdominal: Soft. Bowel sounds are normal. He exhibits no distension. There is no tenderness. There is no rebound and no guarding.  Neurological: He is  alert and oriented to person, place, and time.  Skin: Skin is warm. No erythema.    ED Course  Procedures (including critical care time)  Labs Reviewed  CBC - Abnormal; Notable for the following:    WBC 10.7 (*)    Platelets 145 (*)    All other components within normal limits  BASIC METABOLIC PANEL - Abnormal; Notable for the following:    Glucose, Bld 119 (*)    GFR calc non Af Amer 60 (*)    GFR calc Af Amer 70 (*)    All other components within normal limits  URINALYSIS, ROUTINE W REFLEX MICROSCOPIC - Abnormal; Notable for the following:    Hgb urine dipstick MODERATE (*)     Bilirubin Urine SMALL (*)    Ketones, ur 40 (*)    All other components within normal limits  D-DIMER, QUANTITATIVE  URINE MICROSCOPIC-ADD ON  TROPONIN I  POCT I-STAT TROPONIN I   Dg Chest Port 1 View  08/08/2012  *RADIOLOGY REPORT*  Clinical Data: Chest pain, shortness of breath  PORTABLE CHEST - 1 VIEW  Comparison: Chest x-ray of 07/25/2010  Findings: No active infiltrate or effusion is seen.  Mediastinal contours appear normal.  The heart is within upper limits of normal.  No acute bony abnormality is seen.  IMPRESSION: No active lung disease.  Borderline cardiomegaly.   Original Report Authenticated By: Dwyane Dee, M.D.      No diagnosis found.    MDM   Date: 08/08/2012  Rate: 134  Rhythm: sinus tachycardia  QRS Axis: left  Intervals: normal  ST/T Wave abnormalities: nonspecific ST/T changes  Conduction Disutrbances:none  Narrative Interpretation:   Old EKG Reviewed: none available Hyperactive t waves in v2, v3, ST elevation acR, ST depression v2-v6, t wave inversion inferior leads.  Differential diagnosis includes: ACS syndrome Dissection CHF exacerbation Valvular disorder Myocarditis Pericarditis Pericardial effusion Pneumonia Pleural effusion Pulmonary edema PE Anemia  PT comes in with cc of chest pain. Pt has CAD hx, he is tachycardic, he is complaining of shoulder pain - similar to his MI in the past, he has some EKG changes that are very concerning to me, but no STEMI. I am also concerned of dissection and PE with this tachycardia. Screening dimer ordered, CXR ordered. All the pulses appear equal.  LATE ENTRY    Rate: 128  Rhythm: sinus tachycardia  QRS Axis: left  Intervals: normal  ST/T Wave abnormalities: nonspecific ST/T changes  Conduction Disutrbances:none  Narrative Interpretation:   Old EKG Reviewed: none available Hyperactive t waves in v2, v3, ST elevation acR, ST depression v2-v6, t wave inversion inferior leads.  Pt had no change in  pain with nitro, tropnin is negative, dimer is negative. I spoke with Dr. Charm Barges, Cardiology who is over at [facility], he will see the patient soon. CT Dissection ordered.  11:13 PM  Rate: 134  Rhythm: sinus tachycardia  QRS Axis: left  Intervals: normal  ST/T Wave abnormalities: nonspecific ST/T changes  Conduction Disutrbances:none  Narrative Interpretation:   Old EKG Reviewed: none available Hyperactive t waves in v2, v3, ST elevation acR, ST depression v2-v6, t wave inversion inferior leads.    Derwood Kaplan, MD 08/08/12 2313

## 2012-08-08 NOTE — ED Notes (Signed)
Patient to CT scan

## 2012-08-08 NOTE — ED Notes (Signed)
Patient with pain in left flank/chest radiating to shoulder blade.  Patient states he did have some shortness of breath with the pain.  Patient states he does have some nausea with the pain.  Patient states he has been feeling fatigued with the pain.

## 2012-08-08 NOTE — ED Notes (Signed)
PT reports a nagging pain in his LT upper chest that radiates to back. This started around 1600 today. Pt reports he did not sleep well last night due to an ongoing pain in his RT foot.  Pt reports he has had this pain for at least 1 week. Pt also reports he stepped on glass 1 day ago . A small area located on rt heel noted but area absent of swelling or redness.

## 2012-08-08 NOTE — ED Notes (Signed)
Pt now reports ADB pain that is new. Family at bed side reports family has had a GI virus with N/V .

## 2012-08-08 NOTE — ED Notes (Signed)
CP nagging has not changed

## 2012-08-09 ENCOUNTER — Encounter (HOSPITAL_COMMUNITY): Payer: Self-pay | Admitting: *Deleted

## 2012-08-09 DIAGNOSIS — I252 Old myocardial infarction: Secondary | ICD-10-CM | POA: Diagnosis not present

## 2012-08-09 DIAGNOSIS — I10 Essential (primary) hypertension: Secondary | ICD-10-CM | POA: Diagnosis not present

## 2012-08-09 DIAGNOSIS — R079 Chest pain, unspecified: Secondary | ICD-10-CM | POA: Diagnosis not present

## 2012-08-09 DIAGNOSIS — I2510 Atherosclerotic heart disease of native coronary artery without angina pectoris: Secondary | ICD-10-CM | POA: Diagnosis not present

## 2012-08-09 LAB — CBC WITH DIFFERENTIAL/PLATELET
Basophils Absolute: 0 10*3/uL (ref 0.0–0.1)
Basophils Relative: 0 % (ref 0–1)
Lymphocytes Relative: 9 % — ABNORMAL LOW (ref 12–46)
MCHC: 35 g/dL (ref 30.0–36.0)
Neutro Abs: 7.3 10*3/uL (ref 1.7–7.7)
Platelets: 123 10*3/uL — ABNORMAL LOW (ref 150–400)
RDW: 12.9 % (ref 11.5–15.5)
WBC: 8.5 10*3/uL (ref 4.0–10.5)

## 2012-08-09 LAB — LIPID PANEL
Cholesterol: 96 mg/dL (ref 0–200)
HDL: 36 mg/dL — ABNORMAL LOW (ref >39)
LDL Cholesterol: 48 mg/dL (ref 0–99)
Total CHOL/HDL Ratio: 2.7 ratio
Triglycerides: 60 mg/dL (ref <150)
VLDL: 12 mg/dL (ref 0–40)

## 2012-08-09 LAB — BASIC METABOLIC PANEL
BUN: 16 mg/dL (ref 6–23)
Calcium: 8 mg/dL — ABNORMAL LOW (ref 8.4–10.5)
GFR calc Af Amer: 86 mL/min — ABNORMAL LOW (ref >90)
GFR calc non Af Amer: 74 mL/min — ABNORMAL LOW (ref >90)
Glucose, Bld: 102 mg/dL — ABNORMAL HIGH (ref 70–99)
Potassium: 3.7 mEq/L (ref 3.5–5.1)
Sodium: 140 mEq/L (ref 135–145)

## 2012-08-09 LAB — CBC
HCT: 38.1 % — ABNORMAL LOW (ref 39.0–52.0)
Hemoglobin: 13 g/dL (ref 13.0–17.0)
MCH: 29.4 pg (ref 26.0–34.0)
MCHC: 34.1 g/dL (ref 30.0–36.0)
RDW: 12.9 % (ref 11.5–15.5)

## 2012-08-09 LAB — MRSA PCR SCREENING: MRSA by PCR: NEGATIVE

## 2012-08-09 LAB — TROPONIN I
Troponin I: <0.3 ng/mL (ref <0.30)
Troponin I: <0.3 ng/mL (ref <0.30)

## 2012-08-09 MED ORDER — SODIUM CHLORIDE 0.9 % IV BOLUS (SEPSIS)
1000.0000 mL | Freq: Once | INTRAVENOUS | Status: AC
  Administered 2012-08-09: 1000 mL via INTRAVENOUS

## 2012-08-09 MED ORDER — SODIUM CHLORIDE 0.9 % IV SOLN
INTRAVENOUS | Status: DC
  Administered 2012-08-09 (×2): via INTRAVENOUS

## 2012-08-09 MED ORDER — HEPARIN BOLUS VIA INFUSION
4000.0000 [IU] | Freq: Once | INTRAVENOUS | Status: AC
  Administered 2012-08-09: 4000 [IU] via INTRAVENOUS

## 2012-08-09 MED ORDER — ACETAMINOPHEN 325 MG PO TABS
650.0000 mg | ORAL_TABLET | ORAL | Status: DC | PRN
  Administered 2012-08-09: 650 mg via ORAL
  Filled 2012-08-09: qty 2

## 2012-08-09 MED ORDER — ASPIRIN 325 MG PO TABS
325.0000 mg | ORAL_TABLET | Freq: Every day | ORAL | Status: DC
  Administered 2012-08-09: 325 mg via ORAL
  Filled 2012-08-09: qty 1

## 2012-08-09 MED ORDER — HEPARIN (PORCINE) IN NACL 100-0.45 UNIT/ML-% IJ SOLN
1250.0000 [IU]/h | INTRAMUSCULAR | Status: DC
  Administered 2012-08-09: 1250 [IU]/h via INTRAVENOUS
  Filled 2012-08-09 (×2): qty 250

## 2012-08-09 MED ORDER — METOPROLOL TARTRATE 25 MG PO TABS
25.0000 mg | ORAL_TABLET | Freq: Two times a day (BID) | ORAL | Status: DC
  Administered 2012-08-09: 25 mg via ORAL
  Filled 2012-08-09 (×3): qty 1

## 2012-08-09 MED ORDER — ONDANSETRON HCL 4 MG/2ML IJ SOLN: 4.0000 mg | Freq: Four times a day (QID) | INTRAMUSCULAR | Status: DC | PRN

## 2012-08-09 MED ORDER — NITROGLYCERIN 0.4 MG SL SUBL: 0.4000 mg | SUBLINGUAL_TABLET | SUBLINGUAL | Status: DC | PRN

## 2012-08-09 NOTE — Discharge Summary (Signed)
Patient ID: SERAFINO BURCIAGA MRN: [id number] DOB/AGE: Nov 11, 1945 67 y.o.  Admit date: 08/08/2012 Discharge date: 08/09/2012  Primary Discharge Diagnosis CAD Secondary Discharge Diagnosis Prior MI, shoulder pain, fatigue, dehydration  Significant Diagnostic Studies: none  Consults: None  Hospital Course: 67 y/o man who was admitted with fatigue and shoulder pain.  He has a h/o CAD so cardiac disease was suspected.  His BP was low.  He had been doing physical work in hot weather without drinking a lot of fluid.  He ruled out for MI.  His shoulder pain resolved.  He was hydrated.  His BP was low initally but it improved.  We discussed options and agreed on outpatient stress test.     Discharge Exam: Blood pressure 107/43, pulse 85, temperature 98.2 F (36.8 C), temperature source Oral, resp. rate 16, height 5\' 10"  (1.778 m), weight 89.6 kg (197 lb 8.5 oz), SpO2 100.00%.   [place]/AT RRR S1S2 CTA bilaterally Soft, NT, ND No edema  Labs:   Lab Results  Component Value Date   WBC 8.0 08/09/2012   HGB 13.0 08/09/2012   HCT 38.1* 08/09/2012   MCV 86.2 08/09/2012   PLT 131* 08/09/2012    Recent Labs Lab 08/09/12 0420  NA 140  K 3.7  CL 106  CO2 24  BUN 16  CREATININE 1.02  CALCIUM 8.0*  GLUCOSE 102*   Lab Results  Component Value Date   CKTOTAL 129 07/24/2009   CKMB 0.9 07/24/2009   TROPONINI <0.30 08/09/2012    Lab Results  Component Value Date   CHOL 96 08/09/2012   Lab Results  Component Value Date   HDL 36* 08/09/2012   Lab Results  Component Value Date   LDLCALC 48 08/09/2012   Lab Results  Component Value Date   TRIG 60 08/09/2012   Lab Results  Component Value Date   CHOLHDL 2.7 08/09/2012   No results found for this basename: LDLDIRECT      Radiology:no active cardiac disease EKG: NSR, NSST  FOLLOW UP PLANS AND APPOINTMENTS    Medication List    TAKE these medications       aspirin 325 MG tablet  Take 325 mg by mouth every morning.     metoprolol  tartrate 25 MG tablet  Commonly known as:  LOPRESSOR  Take 25 mg by mouth 2 (two) times daily.     nitroGLYCERIN 0.4 MG SL tablet  Commonly known as:  NITROSTAT  Place 1 tablet (0.4 mg total) under the tongue every 5 (five) minutes as needed for chest pain.     STUDY MEDICATION  Take 1 tablet by mouth every evening. Study through [facility] (for cholesterol)     valsartan 160 MG tablet  Commonly known as:  DIOVAN  Take 160 mg by mouth every morning.           Follow-up Information   Follow up with Corky Crafts., MD. (office will call)    Contact information:   [address] [place] [address] [phone]       BRING ALL MEDICATIONS WITH YOU TO FOLLOW UP APPOINTMENTS  Time spent with patient to include physician time:25 minutes Signed: VARANASI,JAYADEEP S. 08/09/2012, 5:34 PM

## 2012-08-09 NOTE — Progress Notes (Signed)
ANTICOAGULATION CONSULT NOTE - Initial Consult  Pharmacy Consult:  Heparin Indication:  ACS  Allergies  Allergen Reactions  . Zicam Cold Remedy (Erysidoron #1)     Unknown reaction    Patient Measurements: Height: 5\' 10"  (177.8 cm) Weight: 200 lb (90.719 kg) IBW/kg (Calculated) : 73 Heparin Dosing Weight: 91 kg  Vital Signs: Temp: 98.6 F (37 C) (05/13 0031) Temp src: Oral (05/13 0031) BP: 121/68 mmHg (05/13 0029) Pulse Rate: 120 (05/13 0029)  Labs:  Recent Labs  08/08/12 2030 08/08/12 2302  HGB 15.0  --   HCT 42.5  --   PLT 145*  --   CREATININE 1.21  --   TROPONINI  --  <0.30    Estimated Creatinine Clearance: 67.1 ml/min (by C-G formula based on Cr of 1.21).   Medical History: Past Medical History  Diagnosis Date  . Coronary artery disease   . Myocardial infarct         Assessment: 12 YOM with history of CAD s/p MI admitted with chest pain.  Pharmacy consulted to manage IV heparin.  Patient denies taking blood thinners PTA.  Noted he has mild thrombocytopenia.   Goal of Therapy:  Heparin level 0.3-0.7 units/ml Monitor platelets by anticoagulation protocol: Yes    Plan:  - Heparin 4000 units IV bolus x 1, then - Heparin gtt at 1250 units/hr - Check 6 hr HL - Daily HL / CBC    Juanna Pudlo D. Laney Potash, PharmD, BCPS Pager:  [phone] 08/09/2012, 12:49 AM

## 2012-08-09 NOTE — H&P (Signed)
Cardiology IP Consult  Reason for Consult: scapular pain Referring Physician: Rhunette Croft  HPI: Mario [name] is a 67 y.o.male WM followed by Dr. Eldridge Dace for CAD and prior PCI to the LAD in 2007 who presents tonight with fatigue and scapular pain.  He states that he worked in the yard on Saturday and has felt wiped out since then. Today he noted he had some dull subscapular pain that was intermittent throughout the day.  He has not felt like eating over the past 24hrs and hasn't had much to drink either.  He reports that his grandchildren have had a stomach virus recently.  He has not had any GI symptoms.  He is not currently having any chest pain or SOB.  He did undergo a CTA of this chest that was unremarkable.  His last LHC was in 2012 and was unremarkable   Past Medical History  Diagnosis Date  . Coronary artery disease   . Myocardial infarct     Past Surgical History  Procedure Laterality Date  . Coronary stent placement      History reviewed. No pertinent family history.  Social History:  reports that he has never smoked. He does not have any smokeless tobacco history on file. He reports that he does not drink alcohol or use illicit drugs.  Allergies:  Allergies  Allergen Reactions  . Zicam Cold Remedy (Erysidoron #1)     Unknown reaction    Current Facility-Administered Medications  Medication Dose Route Frequency Provider Last Rate Last Dose  . nitroGLYCERIN (NITROSTAT) SL tablet 0.4 mg  0.4 mg Sublingual Q5 min PRN Derwood Kaplan, MD   0.4 mg at 08/08/12 2213  . sodium chloride 0.9 % bolus 1,000 mL  1,000 mL Intravenous Once Meridee Score, MD       Current Outpatient Prescriptions  Medication Sig Dispense Refill  . aspirin 325 MG tablet Take 325 mg by mouth every morning.      . metoprolol tartrate (LOPRESSOR) 25 MG tablet Take 25 mg by mouth 2 (two) times daily.      . STUDY MEDICATION Take 1 tablet by mouth every evening. Study through [place] (for cholesterol)      .  valsartan (DIOVAN) 160 MG tablet Take 160 mg by mouth every morning.        ROS: A full review of systems is obtained and is negative except as noted in the HPI.  Physical Exam: Blood pressure 125/74, pulse 115, temperature 99.6 F (37.6 C), temperature source Oral, resp. rate 19, SpO2 98.00%.  GENERAL: no acute distress. Ill appearing EYES: Extra ocular movements are intact. There is no lid lag. Sclera is anicteric.  ENT: Oropharynx is dry NECK: Supple. The thyroid is not enlarged.  LYMPH: There are no masses or lymphadenopathy present.  HEART: tachy without m/r/g, no JVD LUNGS: Clear to auscultation There are no rales, rhonchi, or wheezes.  ABDOMEN: Soft, non-tender, and non-distended with normoactive bowel sounds. There is no hepatosplenomegaly.  EXTREMITIES: No clubbing, cyanosis, or edema.  PULSES:  DP/PT pulses were +2 and equal bilaterally.  SKIN: Warm, dry, and intact.  NEUROLOGIC: The patient was oriented to person, place, and time. No overt neurologic deficits were detected.  PSYCH: Normal judgment and insight, mood is appropriate.    Results: Results for orders placed during the hospital encounter of 08/08/12 (from the past 24 hour(s))  CBC     Status: Abnormal   Collection Time    08/08/12  8:30 PM  Result Value Range   WBC 10.7 (*) 4.0 - 10.5 K/uL   RBC 4.95  4.22 - 5.81 MIL/uL   Hemoglobin 15.0  13.0 - 17.0 g/dL   HCT 41.3  24.4 - 01.0 %   MCV 85.9  78.0 - 100.0 fL   MCH 30.3  26.0 - 34.0 pg   MCHC 35.3  30.0 - 36.0 g/dL   RDW 27.2  53.6 - 64.4 %   Platelets 145 (*) 150 - 400 K/uL  BASIC METABOLIC PANEL     Status: Abnormal   Collection Time    08/08/12  8:30 PM      Result Value Range   Sodium 139  135 - 145 mEq/L   Potassium 3.8  3.5 - 5.1 mEq/L   Chloride 104  96 - 112 mEq/L   CO2 23  19 - 32 mEq/L   Glucose, Bld 119 (*) 70 - 99 mg/dL   BUN 20  6 - 23 mg/dL   Creatinine, Ser 0.34  0.50 - 1.35 mg/dL   Calcium 9.2  8.4 - 74.2 mg/dL   GFR calc non  Af Amer 60 (*) >90 mL/min   GFR calc Af Amer 70 (*) >90 mL/min  POCT I-STAT TROPONIN I     Status: None   Collection Time    08/08/12  8:50 PM      Result Value Range   Troponin i, poc 0.00  0.00 - 0.08 ng/mL   Comment 3           D-DIMER, QUANTITATIVE     Status: None   Collection Time    08/08/12  9:50 PM      Result Value Range   D-Dimer, Quant 0.35  0.00 - 0.48 ug/mL-FEU  URINALYSIS, ROUTINE W REFLEX MICROSCOPIC     Status: Abnormal   Collection Time    08/08/12 10:03 PM      Result Value Range   Color, Urine YELLOW  YELLOW   APPearance CLEAR  CLEAR   Specific Gravity, Urine 1.030  1.005 - 1.030   pH 5.5  5.0 - 8.0   Glucose, UA NEGATIVE  NEGATIVE mg/dL   Hgb urine dipstick MODERATE (*) NEGATIVE   Bilirubin Urine SMALL (*) NEGATIVE   Ketones, ur 40 (*) NEGATIVE mg/dL   Protein, ur NEGATIVE  NEGATIVE mg/dL   Urobilinogen, UA 0.2  0.0 - 1.0 mg/dL   Nitrite NEGATIVE  NEGATIVE   Leukocytes, UA NEGATIVE  NEGATIVE  URINE MICROSCOPIC-ADD ON     Status: None   Collection Time    08/08/12 10:03 PM      Result Value Range   WBC, UA 0-2  <3 WBC/hpf   RBC / HPF 11-20  <3 RBC/hpf   Bacteria, UA RARE  RARE  TROPONIN I     Status: None   Collection Time    08/08/12 11:02 PM      Result Value Range   Troponin I <0.30  <0.30 ng/mL    CXR: clear EKG: sinus tachycardia with inferolateral ST depression CTA: no dissection, no PE  Assessment/Plan: 67 yo WM with CAD and prior LAD PCI here with subscapular pain similar to prior MI in the setting of volume depletion and possible systemic illness 1. Subscapular pain and EKG changes in setting of sinus tachycardia: no old ekg in the system.  Changes may be in part due to tachycardia but should r/o AMI - ASA 325 - heparin ggt until ruled out - follow cardiac enzymes  tonight - continue home metoprolol - check lipids, on study drug for cholesterol 2. Volume Depletion: no evidence of pneumonia or UTI.  No GI symptoms either. - IV fluids  (1 Liter given in ED, will repeat now) - hold valsartan - orthostatics 3. NPO after midnight  [name] 08/09/2012, 12:24 AM

## 2012-08-23 DIAGNOSIS — R079 Chest pain, unspecified: Secondary | ICD-10-CM | POA: Diagnosis not present

## 2012-08-23 DIAGNOSIS — I2510 Atherosclerotic heart disease of native coronary artery without angina pectoris: Secondary | ICD-10-CM | POA: Diagnosis not present

## 2012-09-06 ENCOUNTER — Encounter (INDEPENDENT_AMBULATORY_CARE_PROVIDER_SITE_OTHER): Payer: Medicare Other

## 2012-09-06 DIAGNOSIS — R0989 Other specified symptoms and signs involving the circulatory and respiratory systems: Secondary | ICD-10-CM

## 2012-10-20 DIAGNOSIS — Z79899 Other long term (current) drug therapy: Secondary | ICD-10-CM | POA: Diagnosis not present

## 2012-10-20 DIAGNOSIS — E785 Hyperlipidemia, unspecified: Secondary | ICD-10-CM | POA: Diagnosis not present

## 2012-12-20 DIAGNOSIS — D1801 Hemangioma of skin and subcutaneous tissue: Secondary | ICD-10-CM | POA: Diagnosis not present

## 2012-12-20 DIAGNOSIS — L821 Other seborrheic keratosis: Secondary | ICD-10-CM | POA: Diagnosis not present

## 2012-12-20 DIAGNOSIS — L819 Disorder of pigmentation, unspecified: Secondary | ICD-10-CM | POA: Diagnosis not present

## 2013-02-02 ENCOUNTER — Ambulatory Visit (INDEPENDENT_AMBULATORY_CARE_PROVIDER_SITE_OTHER): Payer: Medicare Other | Admitting: *Deleted

## 2013-02-02 DIAGNOSIS — E785 Hyperlipidemia, unspecified: Secondary | ICD-10-CM | POA: Diagnosis not present

## 2013-02-02 LAB — LIPID PANEL
HDL: 35.3 mg/dL — ABNORMAL LOW (ref >39.00)
LDL Cholesterol: 50 mg/dL (ref 0–99)
Total CHOL/HDL Ratio: 3
Triglycerides: 132 mg/dL (ref 0.0–149.0)

## 2013-02-02 LAB — HEPATIC FUNCTION PANEL
Albumin: 4.2 g/dL (ref 3.5–5.2)
Total Bilirubin: 1.6 mg/dL — ABNORMAL HIGH (ref 0.3–1.2)

## 2013-02-06 ENCOUNTER — Encounter: Payer: Self-pay | Admitting: General Surgery

## 2013-02-06 ENCOUNTER — Telehealth: Payer: Self-pay | Admitting: General Surgery

## 2013-02-06 DIAGNOSIS — E785 Hyperlipidemia, unspecified: Secondary | ICD-10-CM

## 2013-02-06 DIAGNOSIS — Z79899 Other long term (current) drug therapy: Secondary | ICD-10-CM

## 2013-02-06 MED ORDER — SIMVASTATIN 40 MG PO TABS: 40.0000 mg | ORAL_TABLET | Freq: Every day | ORAL | Status: DC

## 2013-02-06 NOTE — Telephone Encounter (Signed)
Message copied by Nita Sells on Mon Feb 06, 2013  1:13 PM ------      Message from: [place], [place] G      Created: Fri Feb 03, 2013  2:04 PM       RF:  CAD, HTN, age, low HDL - LDL goal < 70, non-HDL goal < 100.      Meds:  atorvastatin 40 mg qd.      Patient was on IMPROVE-IT study until recently (therefore simvastatin 40 mg OR Vytorin 10/40 mg qd).  Was changed to simvastatin 40 mg after end of study, but non-HDL remained ~ 110 mg/dL, and LDL was ~ 70.  He was therefore changed to atorvastatin 40 mg qd, and now LDL is 50 mg/dL, and non-HDL 77 mg/dL (much better).        LFTs okay.      Plan:      1. No change in meds.      2.  Recheck lipid panel and hepatic panel in 6 months.      Please notify patient, update med list (atorvastatin 40 mg qd now - no longer on study med), and set up labs. Thanks.       ------

## 2013-02-15 DIAGNOSIS — L719 Rosacea, unspecified: Secondary | ICD-10-CM | POA: Diagnosis not present

## 2013-02-15 DIAGNOSIS — H01009 Unspecified blepharitis unspecified eye, unspecified eyelid: Secondary | ICD-10-CM | POA: Diagnosis not present

## 2013-02-16 DIAGNOSIS — H35039 Hypertensive retinopathy, unspecified eye: Secondary | ICD-10-CM | POA: Diagnosis not present

## 2013-02-16 DIAGNOSIS — H409 Unspecified glaucoma: Secondary | ICD-10-CM | POA: Diagnosis not present

## 2013-02-16 DIAGNOSIS — H401390 Pigmentary glaucoma, unspecified eye, stage unspecified: Secondary | ICD-10-CM | POA: Diagnosis not present

## 2013-02-16 DIAGNOSIS — H21239 Degeneration of iris (pigmentary), unspecified eye: Secondary | ICD-10-CM | POA: Diagnosis not present

## 2013-02-16 DIAGNOSIS — H2510 Age-related nuclear cataract, unspecified eye: Secondary | ICD-10-CM | POA: Diagnosis not present

## 2013-03-02 DIAGNOSIS — I10 Essential (primary) hypertension: Secondary | ICD-10-CM | POA: Diagnosis not present

## 2013-03-02 DIAGNOSIS — Z1331 Encounter for screening for depression: Secondary | ICD-10-CM | POA: Diagnosis not present

## 2013-03-02 DIAGNOSIS — R5381 Other malaise: Secondary | ICD-10-CM | POA: Diagnosis not present

## 2013-03-02 DIAGNOSIS — N529 Male erectile dysfunction, unspecified: Secondary | ICD-10-CM | POA: Diagnosis not present

## 2013-03-02 DIAGNOSIS — E291 Testicular hypofunction: Secondary | ICD-10-CM | POA: Diagnosis not present

## 2013-03-02 DIAGNOSIS — Z0000 Encounter for general adult medical examination without abnormal findings: Secondary | ICD-10-CM | POA: Diagnosis not present

## 2013-03-02 DIAGNOSIS — I252 Old myocardial infarction: Secondary | ICD-10-CM | POA: Diagnosis not present

## 2013-03-03 DIAGNOSIS — Z0000 Encounter for general adult medical examination without abnormal findings: Secondary | ICD-10-CM | POA: Diagnosis not present

## 2013-03-03 DIAGNOSIS — E291 Testicular hypofunction: Secondary | ICD-10-CM | POA: Diagnosis not present

## 2013-03-03 DIAGNOSIS — R5381 Other malaise: Secondary | ICD-10-CM | POA: Diagnosis not present

## 2013-03-03 DIAGNOSIS — Z125 Encounter for screening for malignant neoplasm of prostate: Secondary | ICD-10-CM | POA: Diagnosis not present

## 2013-03-03 DIAGNOSIS — R7301 Impaired fasting glucose: Secondary | ICD-10-CM | POA: Diagnosis not present

## 2013-03-13 DIAGNOSIS — E291 Testicular hypofunction: Secondary | ICD-10-CM | POA: Diagnosis not present

## 2013-03-13 DIAGNOSIS — R7989 Other specified abnormal findings of blood chemistry: Secondary | ICD-10-CM | POA: Diagnosis not present

## 2013-03-13 DIAGNOSIS — R5381 Other malaise: Secondary | ICD-10-CM | POA: Diagnosis not present

## 2013-03-29 DIAGNOSIS — J019 Acute sinusitis, unspecified: Secondary | ICD-10-CM | POA: Diagnosis not present

## 2013-05-01 DIAGNOSIS — N529 Male erectile dysfunction, unspecified: Secondary | ICD-10-CM | POA: Diagnosis not present

## 2013-05-01 DIAGNOSIS — E291 Testicular hypofunction: Secondary | ICD-10-CM | POA: Diagnosis not present

## 2013-05-01 DIAGNOSIS — J111 Influenza due to unidentified influenza virus with other respiratory manifestations: Secondary | ICD-10-CM | POA: Diagnosis not present

## 2013-06-05 ENCOUNTER — Encounter: Payer: Self-pay | Admitting: Interventional Cardiology

## 2013-07-11 ENCOUNTER — Ambulatory Visit: Payer: Medicare Other | Admitting: Interventional Cardiology

## 2013-07-13 DIAGNOSIS — L570 Actinic keratosis: Secondary | ICD-10-CM | POA: Diagnosis not present

## 2013-07-13 DIAGNOSIS — D1801 Hemangioma of skin and subcutaneous tissue: Secondary | ICD-10-CM | POA: Diagnosis not present

## 2013-07-13 DIAGNOSIS — D219 Benign neoplasm of connective and other soft tissue, unspecified: Secondary | ICD-10-CM | POA: Diagnosis not present

## 2013-07-13 DIAGNOSIS — D239 Other benign neoplasm of skin, unspecified: Secondary | ICD-10-CM | POA: Diagnosis not present

## 2013-07-13 DIAGNOSIS — L821 Other seborrheic keratosis: Secondary | ICD-10-CM | POA: Diagnosis not present

## 2013-07-13 DIAGNOSIS — L819 Disorder of pigmentation, unspecified: Secondary | ICD-10-CM | POA: Diagnosis not present

## 2013-08-02 ENCOUNTER — Ambulatory Visit (INDEPENDENT_AMBULATORY_CARE_PROVIDER_SITE_OTHER): Payer: Medicare Other | Admitting: Interventional Cardiology

## 2013-08-02 ENCOUNTER — Other Ambulatory Visit: Payer: Medicare Other

## 2013-08-02 ENCOUNTER — Encounter: Payer: Self-pay | Admitting: Interventional Cardiology

## 2013-08-02 VITALS — BP 141/74 | HR 88 | Ht 70.0 in | Wt 193.1 lb

## 2013-08-02 DIAGNOSIS — I2510 Atherosclerotic heart disease of native coronary artery without angina pectoris: Secondary | ICD-10-CM | POA: Diagnosis not present

## 2013-08-02 DIAGNOSIS — E785 Hyperlipidemia, unspecified: Secondary | ICD-10-CM

## 2013-08-02 DIAGNOSIS — R011 Cardiac murmur, unspecified: Secondary | ICD-10-CM | POA: Diagnosis not present

## 2013-08-02 DIAGNOSIS — I10 Essential (primary) hypertension: Secondary | ICD-10-CM

## 2013-08-02 MED ORDER — ATORVASTATIN CALCIUM 40 MG PO TABS: 40.0000 mg | ORAL_TABLET | Freq: Every day | ORAL | Status: DC

## 2013-08-02 MED ORDER — NITROGLYCERIN 0.4 MG SL SUBL: 0.4000 mg | SUBLINGUAL_TABLET | SUBLINGUAL | Status: DC | PRN

## 2013-08-02 NOTE — Patient Instructions (Signed)
Your physician recommends that you return for a FASTING lipid profile and hepatic on 10/11/13.   Your physician recommends that you continue on your current medications as directed. Please refer to the Current Medication list given to you today.  Your physician wants you to follow-up in: 1 year with Dr. Irish Lack. You will receive a reminder letter in the mail two months in advance. If you don't receive a letter, please call our office to schedule the follow-up appointment.  Your physician has requested that you regularly monitor and record your blood pressure readings at home. Please use the same machine at the same time of day to check your readings and record them. Call if BP is consistently above 140/90.

## 2013-08-02 NOTE — Progress Notes (Signed)
Patient ID: Mario [name], male   DOB: 1945-09-28, 68 y.o.   MRN: [id number]    [place], [name], [name]  [address] Phone: [phone] Fax:  [phone]  Date:  08/02/2013   ID:  Mario [name], DOB [birth year], MRN [id number]  PCP:  Irven Shelling, MD      History of Present Illness: Mario [name] is a 68 y.o. male who had an anterior MI, and BMS to the LAD in 2007. He had a cardiac cath in 2012 showing a patent LAD stent. BP was high for a while a year ago. Metoprolol was increased with some improvement. Not exercising at this time. He felt fatigued and decreased his metoprolol on his own. He did not check his BP on the lower metoprolol. CAD/ASCVD:  Denies : Chest pain.  Dizziness.  Dyspnea on exertion.  Leg edema.  Nitroglycerin.  Orthopnea.  Palpitations.  Paroxysmal nocturnal dyspnea.  Syncope.   Main exercise is work. He goes up ladders. He does not do too much walking on his own just for exercise.  Feels generally tired. No specific chest discomfort or back pain. During his MI in 2007, he had pain mostly in his back.  Wt Readings from Last 3 Encounters:  08/02/13 193 lb 1.9 oz (87.599 kg)  08/09/12 197 lb 8.5 oz (89.6 kg)     Past Medical History  Diagnosis Date  . Coronary artery disease   . Myocardial infarct   . Old myocardial infarction   . Essential hypertension, benign     Current Outpatient Prescriptions  Medication Sig Dispense Refill  . aspirin EC 81 MG tablet Take 81 mg by mouth daily.      Marland Kitchen atorvastatin (LIPITOR) 40 MG tablet Take 40 mg by mouth daily.      . metoprolol tartrate (LOPRESSOR) 25 MG tablet Take 25 mg by mouth 2 (two) times daily.      . nitroGLYCERIN (NITROSTAT) 0.4 MG SL tablet Place 1 tablet (0.4 mg total) under the tongue every 5 (five) minutes as needed for chest pain.  25 tablet  11  . valsartan (DIOVAN) 160 MG tablet Take 160 mg by mouth every morning.       No current facility-administered medications for this  visit.    Allergies:    Allergies  Allergen Reactions  . Zicam Cold Remedy [Erysidoron #1]     Unknown reaction  . Sulfa Antibiotics Rash    Social History:  The patient  reports that he has never smoked. He does not have any smokeless tobacco history on file. He reports that he does not drink alcohol or use illicit drugs.   Family History:  The patient's family history includes CAD in his father; CVA in his father; Hypertension in his brother; Lung cancer in his mother.   ROS:  Please see the history of present illness.  No nausea, vomiting.  No fevers, chills.  No focal weakness.  No dysuria. Reports Malaise.  No specific cardiac complaints.   All other systems reviewed and negative.   PHYSICAL EXAM: VS:  BP 141/74  Pulse 88  Ht 5\' 10"  (1.778 m)  Wt 193 lb 1.9 oz (87.599 kg)  BMI 27.71 kg/m2 Well nourished, well developed, in no acute distress HEENT: normal Neck: no JVD, no carotid bruits Cardiac:  normal S1, S2; RRR;  Lungs:  clear to auscultation bilaterally, no wheezing, rhonchi or rales Abd: soft, nontender, no hepatomegaly Ext: no edema Skin: warm and  dry Neuro:   no focal abnormalities noted  EKG:  NSR, inferior T wave inversion and ST segment changes which are unchanged  ASSESSMENT AND PLAN:  Coronary atherosclerosis of native coronary artery  Continue Aspirin Tablet, 81 MG, 1 tablet, Orally, Once a day Continue Nitroglycerin 0.4 mg tablet, 0.4 mg, 1 tablet, SL, under tongue every 5 minutes x 3 dosages as needed for chest pain IMAGING: EKG    Harward,Amy 07/12/2012 08:57:05 AM > VARANASI,JAY 07/12/2012 09:30:45 AM > NSR, non specific inf ST segment changes. no change from 2013   Notes: No angina.    2. Essential hypertension, benign  Continue Diovan Tablet, 160 MG, 1 tablet, Orally, Once a day Decreased Metoprolol Tartrate tab, 25 mg, half tab, orally, twice a day, 180, Refills 3 Notes: Borderline Controll. Can check at home periodically.    3.  Hypercholesteremia, pure  Continue atorvastatin.  Study is over.  TG 202.  HDL 35, LDL 68, TC 144 in 7/14. Will recheck in 7/15.   Heart murmur: Had basal septal hypertrophy on echo in 2011.   No signs of heart failure.   Preventive Medicine  Adult topics discussed:  Diet: healthy diet, low salt.  Exercise: 5 days a week, at least 30 minutes of aerobic exercise.      Signed, Mina Marble, MD, [facility] 08/02/2013 4:35 PM

## 2013-08-07 ENCOUNTER — Other Ambulatory Visit: Payer: Medicare Other

## 2013-08-18 ENCOUNTER — Other Ambulatory Visit: Payer: Self-pay

## 2013-08-18 MED ORDER — METOPROLOL TARTRATE 25 MG PO TABS: 25.0000 mg | ORAL_TABLET | Freq: Two times a day (BID) | ORAL | Status: DC

## 2013-10-11 ENCOUNTER — Other Ambulatory Visit: Payer: Medicare Other

## 2013-10-12 ENCOUNTER — Other Ambulatory Visit: Payer: Self-pay

## 2013-10-12 MED ORDER — METOPROLOL TARTRATE 25 MG PO TABS
25.0000 mg | ORAL_TABLET | Freq: Two times a day (BID) | ORAL | Status: DC
Start: 2013-10-12 — End: 2014-01-09

## 2014-01-09 ENCOUNTER — Other Ambulatory Visit: Payer: Self-pay | Admitting: Interventional Cardiology

## 2014-01-11 DIAGNOSIS — H21233 Degeneration of iris (pigmentary), bilateral: Secondary | ICD-10-CM | POA: Diagnosis not present

## 2014-01-11 DIAGNOSIS — H401330 Pigmentary glaucoma, bilateral, stage unspecified: Secondary | ICD-10-CM | POA: Diagnosis not present

## 2014-02-05 DIAGNOSIS — H903 Sensorineural hearing loss, bilateral: Secondary | ICD-10-CM | POA: Diagnosis not present

## 2014-02-08 DIAGNOSIS — L718 Other rosacea: Secondary | ICD-10-CM | POA: Diagnosis not present

## 2014-02-08 DIAGNOSIS — H01009 Unspecified blepharitis unspecified eye, unspecified eyelid: Secondary | ICD-10-CM | POA: Diagnosis not present

## 2014-02-08 DIAGNOSIS — H401330 Pigmentary glaucoma, bilateral, stage unspecified: Secondary | ICD-10-CM | POA: Diagnosis not present

## 2014-02-08 DIAGNOSIS — H21233 Degeneration of iris (pigmentary), bilateral: Secondary | ICD-10-CM | POA: Diagnosis not present

## 2014-03-05 DIAGNOSIS — H40053 Ocular hypertension, bilateral: Secondary | ICD-10-CM | POA: Diagnosis not present

## 2014-03-05 DIAGNOSIS — H401431 Capsular glaucoma with pseudoexfoliation of lens, bilateral, mild stage: Secondary | ICD-10-CM | POA: Diagnosis not present

## 2014-03-27 DIAGNOSIS — H401431 Capsular glaucoma with pseudoexfoliation of lens, bilateral, mild stage: Secondary | ICD-10-CM | POA: Diagnosis not present

## 2014-04-10 DIAGNOSIS — J019 Acute sinusitis, unspecified: Secondary | ICD-10-CM | POA: Diagnosis not present

## 2014-04-10 DIAGNOSIS — R05 Cough: Secondary | ICD-10-CM | POA: Diagnosis not present

## 2014-04-26 DIAGNOSIS — J018 Other acute sinusitis: Secondary | ICD-10-CM | POA: Diagnosis not present

## 2014-05-01 DIAGNOSIS — H401411 Capsular glaucoma with pseudoexfoliation of lens, right eye, mild stage: Secondary | ICD-10-CM | POA: Diagnosis not present

## 2014-05-10 ENCOUNTER — Other Ambulatory Visit: Payer: Self-pay | Admitting: Internal Medicine

## 2014-05-10 ENCOUNTER — Ambulatory Visit
Admission: RE | Admit: 2014-05-10 | Discharge: 2014-05-10 | Disposition: A | Discharge status: Home / Self Care | Payer: Medicare Other | Source: Ambulatory Visit | Attending: Internal Medicine | Admitting: Internal Medicine

## 2014-05-10 DIAGNOSIS — R911 Solitary pulmonary nodule: Secondary | ICD-10-CM | POA: Diagnosis not present

## 2014-05-10 DIAGNOSIS — R05 Cough: Secondary | ICD-10-CM | POA: Diagnosis not present

## 2014-05-17 ENCOUNTER — Other Ambulatory Visit: Payer: Self-pay | Admitting: Internal Medicine

## 2014-05-17 DIAGNOSIS — R9389 Abnormal findings on diagnostic imaging of other specified body structures: Secondary | ICD-10-CM

## 2014-05-18 ENCOUNTER — Other Ambulatory Visit: Payer: Self-pay | Admitting: Interventional Cardiology

## 2014-05-24 ENCOUNTER — Ambulatory Visit
Admission: RE | Admit: 2014-05-24 | Discharge: 2014-05-24 | Disposition: A | Discharge status: Home / Self Care | Payer: Medicare Other | Source: Ambulatory Visit | Attending: Internal Medicine | Admitting: Internal Medicine

## 2014-05-24 DIAGNOSIS — I2510 Atherosclerotic heart disease of native coronary artery without angina pectoris: Secondary | ICD-10-CM | POA: Diagnosis not present

## 2014-05-24 DIAGNOSIS — R9389 Abnormal findings on diagnostic imaging of other specified body structures: Secondary | ICD-10-CM

## 2014-05-24 DIAGNOSIS — J8410 Pulmonary fibrosis, unspecified: Secondary | ICD-10-CM | POA: Diagnosis not present

## 2014-05-24 MED ORDER — IOHEXOL 300 MG/ML  SOLN
75.0000 mL | Freq: Once | INTRAMUSCULAR | Status: AC | PRN
  Administered 2014-05-24: 75 mL via INTRAVENOUS

## 2014-06-01 DIAGNOSIS — R509 Fever, unspecified: Secondary | ICD-10-CM | POA: Diagnosis not present

## 2014-06-01 DIAGNOSIS — R05 Cough: Secondary | ICD-10-CM | POA: Diagnosis not present

## 2014-06-11 DIAGNOSIS — H401431 Capsular glaucoma with pseudoexfoliation of lens, bilateral, mild stage: Secondary | ICD-10-CM | POA: Diagnosis not present

## 2014-06-12 DIAGNOSIS — Z20818 Contact with and (suspected) exposure to other bacterial communicable diseases: Secondary | ICD-10-CM | POA: Diagnosis not present

## 2014-07-12 DIAGNOSIS — L570 Actinic keratosis: Secondary | ICD-10-CM | POA: Diagnosis not present

## 2014-07-12 DIAGNOSIS — L821 Other seborrheic keratosis: Secondary | ICD-10-CM | POA: Diagnosis not present

## 2014-07-12 DIAGNOSIS — D1801 Hemangioma of skin and subcutaneous tissue: Secondary | ICD-10-CM | POA: Diagnosis not present

## 2014-07-12 DIAGNOSIS — L812 Freckles: Secondary | ICD-10-CM | POA: Diagnosis not present

## 2014-08-10 DIAGNOSIS — H401431 Capsular glaucoma with pseudoexfoliation of lens, bilateral, mild stage: Secondary | ICD-10-CM | POA: Diagnosis not present

## 2014-08-10 DIAGNOSIS — H40033 Anatomical narrow angle, bilateral: Secondary | ICD-10-CM | POA: Diagnosis not present

## 2014-08-20 ENCOUNTER — Encounter: Payer: Self-pay | Admitting: Interventional Cardiology

## 2014-08-20 ENCOUNTER — Ambulatory Visit (INDEPENDENT_AMBULATORY_CARE_PROVIDER_SITE_OTHER): Payer: Medicare Other | Admitting: Interventional Cardiology

## 2014-08-20 VITALS — BP 136/80 | HR 68 | Ht 69.0 in | Wt 194.0 lb

## 2014-08-20 DIAGNOSIS — I2510 Atherosclerotic heart disease of native coronary artery without angina pectoris: Secondary | ICD-10-CM

## 2014-08-20 DIAGNOSIS — E785 Hyperlipidemia, unspecified: Secondary | ICD-10-CM | POA: Diagnosis not present

## 2014-08-20 DIAGNOSIS — I10 Essential (primary) hypertension: Secondary | ICD-10-CM | POA: Diagnosis not present

## 2014-08-20 MED ORDER — ATORVASTATIN CALCIUM 40 MG PO TABS: 40.0000 mg | ORAL_TABLET | Freq: Every day | ORAL | Status: DC

## 2014-08-20 NOTE — Patient Instructions (Signed)
**Note De-identified Daysean Tinkham Obfuscation** Medication Instructions:  Same  Labwork: None  Testing/Procedures: None  Follow-Up: Your physician wants you to follow-up in: 1 year. You will receive a reminder letter in the mail two months in advance. If you don't receive a letter, please call our office to schedule the follow-up appointment.      

## 2014-08-20 NOTE — Progress Notes (Signed)
Patient ID: Mario [name], [name]   DOB: October 31, 1945, 69 y.o.   MRN: [id number]     Cardiology Office Note   Date:  08/20/2014   ID:  Mario [name], DOB [birth year], MRN [id number]  PCP:  Irven Shelling, MD    No chief complaint on file. f/u CAD   Wt Readings from Last 3 Encounters:  08/20/14 194 lb (87.998 kg)  08/02/13 193 lb 1.9 oz (87.599 kg)  08/09/12 197 lb 8.5 oz (89.6 kg)       History of Present Illness: Mario [name] is a 69 y.o. [name]  who had an anterior MI, and BMS to the LAD in 2007. He had a cardiac cath in 2012 showing a patent LAD stent. BP not checked much. He did not check his BP on the lower metoprolol.  He felt fatigue in metoprolol.  He takes a nap in the evening and feels better. CAD/ASCVD:  Denies : Chest pain.  Dizziness.  Dyspnea on exertion.  Leg edema.  Nitroglycerin.  Orthopnea.  Palpitations.  Paroxysmal nocturnal dyspnea.  Syncope.   Main exercise is work. He goes up ladders. He does not do too much walking on his own just for exercise. No specific chest discomfort or back pain. During his MI in 2007, he had pain mostly in his back.  He intends to exercise more.  He does not monitor salt intake.  He eats fast food.    Past Medical History  Diagnosis Date  . Coronary artery disease   . Myocardial infarct   . Old myocardial infarction   . Essential hypertension, benign     Past Surgical History  Procedure Laterality Date  . Coronary stent placement    . Rotator cuff surgery Bilateral      Current Outpatient Prescriptions  Medication Sig Dispense Refill  . aspirin EC 81 MG tablet Take 81 mg by mouth daily.    Marland Kitchen atorvastatin (LIPITOR) 40 MG tablet TAKE 1 TABLET (40 MG TOTAL) BY MOUTH DAILY. 90 tablet 0  . metoprolol tartrate (LOPRESSOR) 25 MG tablet TAKE 1 TABLET (25 MG TOTAL) BY MOUTH 2 (TWO) TIMES DAILY. 180 tablet 3  . nitroGLYCERIN (NITROSTAT) 0.4 MG SL tablet Place 1 tablet (0.4 mg total) under the tongue every 5 (five) minutes  as needed for chest pain. 25 tablet 5  . TRAVATAN Z 0.004 % SOLN ophthalmic solution Place 1 drop into both eyes at bedtime.   2  . valsartan (DIOVAN) 160 MG tablet Take 160 mg by mouth every morning.     No current facility-administered medications for this visit.    Allergies:   Zicam cold remedy and Sulfa antibiotics    Social History:  The patient  reports that he has never smoked. He does not have any smokeless tobacco history on file. He reports that he does not drink alcohol or use illicit drugs.   Family History:  The patient's *family history includes CAD in his father; CVA in his father; Hypertension in his brother; Lung cancer in his mother.    ROS:  Please see the history of present illness.   Otherwise, review of systems are positive for *.   All other systems are reviewed and negative.    PHYSICAL EXAM: VS:  BP 136/80 mmHg  Pulse 68  Ht 5\' 9"  (1.753 m)  Wt 194 lb (87.998 kg)  BMI 28.64 kg/m2 , BMI Body mass index is 28.64 kg/(m^2). GEN: Well nourished, well developed, in no acute distress HEENT:  normal Neck: no JVD, carotid bruits, or masses Cardiac: *RRR; no murmurs, rubs, or gallops,no edema  Respiratory:  clear to auscultation bilaterally, normal work of breathing GI: soft, nontender, nondistended, + BS MS: no deformity or atrophy Skin: warm and dry, no rash Neuro:  Strength and sensation are intact Psych: euthymic mood, full affect   EKG:   The ekg ordered today demonstrates NSR with NSST.   Recent Labs: No results found for requested labs within last 365 days.   Lipid Panel    Component Value Date/Time   CHOL 112 02/02/2013 0825   TRIG 132.0 02/02/2013 0825   HDL 35.30* 02/02/2013 0825   CHOLHDL 3 02/02/2013 0825   VLDL 26.4 02/02/2013 0825   LDLCALC 50 02/02/2013 0825     Other studies Reviewed: Additional studies/ records that were reviewed today with results demonstrating: .   ASSESSMENT AND PLAN:  1. CAD: Coronary atherosclerosis of  native coronary artery  Continue Aspirin Tablet, 81 MG, 1 tablet, Orally, Once a day Continue Nitroglycerin 0.4 mg tablet, 0.4 mg, 1 tablet, SL, under tongue every 5 minutes x 3 dosages as needed for chest pain. No angina. Essential hypertension, benign  Continue Diovan Tablet, 160 MG, 1 tablet, Orally, Once a day Decreased Metoprolol Tartrate tab, 25 mg, half tab, orally, twice a day, 180, Refills 3 Notes: Borderline Controll. Can check at home periodically.  Hypercholesteremia, pure  Continue atorvastatin. Study is over. TG 202. HDL 35, LDL 68, TC 144 in 7/14.  Recheck in 7/15: LDL 39, HDL 38, TG 155,    Heart murmur: Had basal septal hypertrophy on echo in 2011. No signs of heart failure.       Fatigue: Avastin 2 have his wife check him for signs of sleep apnea. He does not report any dry mouth or morning headaches. He has not fallen asleep unintentionally.   Current medicines are reviewed at length with the patient today.  The patient concerns regarding his medicines were addressed.  The following changes have been made:  No change  Labs/ tests ordered today include: labs done at the [place]. No orders of the defined types were placed in this encounter.    Recommend 150 minutes/week of aerobic exercise Low fat, low carb, high fiber diet recommended; decrease salt intake  Disposition:   FU in 1 year   Teresita Madura., MD  08/20/2014 10:02 AM    [facility] [place], [place], [place]  [address] Phone: [phone]; Fax: [phone]

## 2014-08-29 ENCOUNTER — Telehealth: Payer: Self-pay | Admitting: Interventional Cardiology

## 2014-08-29 DIAGNOSIS — I10 Essential (primary) hypertension: Secondary | ICD-10-CM

## 2014-08-29 DIAGNOSIS — E785 Hyperlipidemia, unspecified: Secondary | ICD-10-CM

## 2014-08-29 NOTE — Telephone Encounter (Signed)
Attempted to call pt, left voicemail to call back. Calling to have pt come in for labs (LFT, Lipids). Labs were due 09/2013.

## 2014-08-30 NOTE — Telephone Encounter (Signed)
Spoke with pt and informed him of need for labs. Pt verbalized understanding and was in agreement with this plan.

## 2014-08-31 ENCOUNTER — Other Ambulatory Visit (INDEPENDENT_AMBULATORY_CARE_PROVIDER_SITE_OTHER): Payer: Medicare Other | Admitting: *Deleted

## 2014-08-31 DIAGNOSIS — E785 Hyperlipidemia, unspecified: Secondary | ICD-10-CM

## 2014-08-31 DIAGNOSIS — I10 Essential (primary) hypertension: Secondary | ICD-10-CM | POA: Diagnosis not present

## 2014-08-31 LAB — HEPATIC FUNCTION PANEL
ALBUMIN: 4.1 g/dL (ref 3.5–5.2)
ALK PHOS: 50 U/L (ref 39–117)
ALT: 17 U/L (ref 0–53)
AST: 22 U/L (ref 0–37)
Bilirubin, Direct: 0.3 mg/dL (ref 0.0–0.3)
TOTAL PROTEIN: 7 g/dL (ref 6.0–8.3)
Total Bilirubin: 1.8 mg/dL — ABNORMAL HIGH (ref 0.2–1.2)

## 2014-08-31 LAB — LIPID PANEL
CHOLESTEROL: 100 mg/dL (ref 0–200)
HDL: 35 mg/dL — AB (ref >39.00)
LDL CALC: 40 mg/dL (ref 0–99)
NONHDL: 65
Total CHOL/HDL Ratio: 3
Triglycerides: 126 mg/dL (ref 0.0–149.0)
VLDL: 25.2 mg/dL (ref 0.0–40.0)

## 2014-10-29 DIAGNOSIS — H40033 Anatomical narrow angle, bilateral: Secondary | ICD-10-CM | POA: Diagnosis not present

## 2014-10-30 DIAGNOSIS — H903 Sensorineural hearing loss, bilateral: Secondary | ICD-10-CM | POA: Diagnosis not present

## 2014-11-14 DIAGNOSIS — H40033 Anatomical narrow angle, bilateral: Secondary | ICD-10-CM | POA: Diagnosis not present

## 2014-11-14 DIAGNOSIS — H04123 Dry eye syndrome of bilateral lacrimal glands: Secondary | ICD-10-CM | POA: Diagnosis not present

## 2014-11-14 DIAGNOSIS — H01009 Unspecified blepharitis unspecified eye, unspecified eyelid: Secondary | ICD-10-CM | POA: Diagnosis not present

## 2014-11-14 DIAGNOSIS — H401431 Capsular glaucoma with pseudoexfoliation of lens, bilateral, mild stage: Secondary | ICD-10-CM | POA: Diagnosis not present

## 2014-11-14 DIAGNOSIS — L718 Other rosacea: Secondary | ICD-10-CM | POA: Diagnosis not present

## 2014-12-31 DIAGNOSIS — Z23 Encounter for immunization: Secondary | ICD-10-CM | POA: Diagnosis not present

## 2015-01-03 ENCOUNTER — Other Ambulatory Visit: Payer: Self-pay | Admitting: Interventional Cardiology

## 2015-01-31 DIAGNOSIS — H01009 Unspecified blepharitis unspecified eye, unspecified eyelid: Secondary | ICD-10-CM | POA: Diagnosis not present

## 2015-01-31 DIAGNOSIS — H401431 Capsular glaucoma with pseudoexfoliation of lens, bilateral, mild stage: Secondary | ICD-10-CM | POA: Diagnosis not present

## 2015-01-31 DIAGNOSIS — H35033 Hypertensive retinopathy, bilateral: Secondary | ICD-10-CM | POA: Diagnosis not present

## 2015-01-31 DIAGNOSIS — H40033 Anatomical narrow angle, bilateral: Secondary | ICD-10-CM | POA: Diagnosis not present

## 2015-01-31 DIAGNOSIS — H25012 Cortical age-related cataract, left eye: Secondary | ICD-10-CM | POA: Diagnosis not present

## 2015-01-31 DIAGNOSIS — H2512 Age-related nuclear cataract, left eye: Secondary | ICD-10-CM | POA: Diagnosis not present

## 2015-04-17 IMAGING — CR DG CHEST 1V PORT
1 series · 1 of 1 positions shown · non-contrast
Comparison: Chest x-ray of 07/25/2010

CLINICAL DATA: Chest pain, shortness of breath

PORTABLE CHEST - 1 VIEW

[AP]
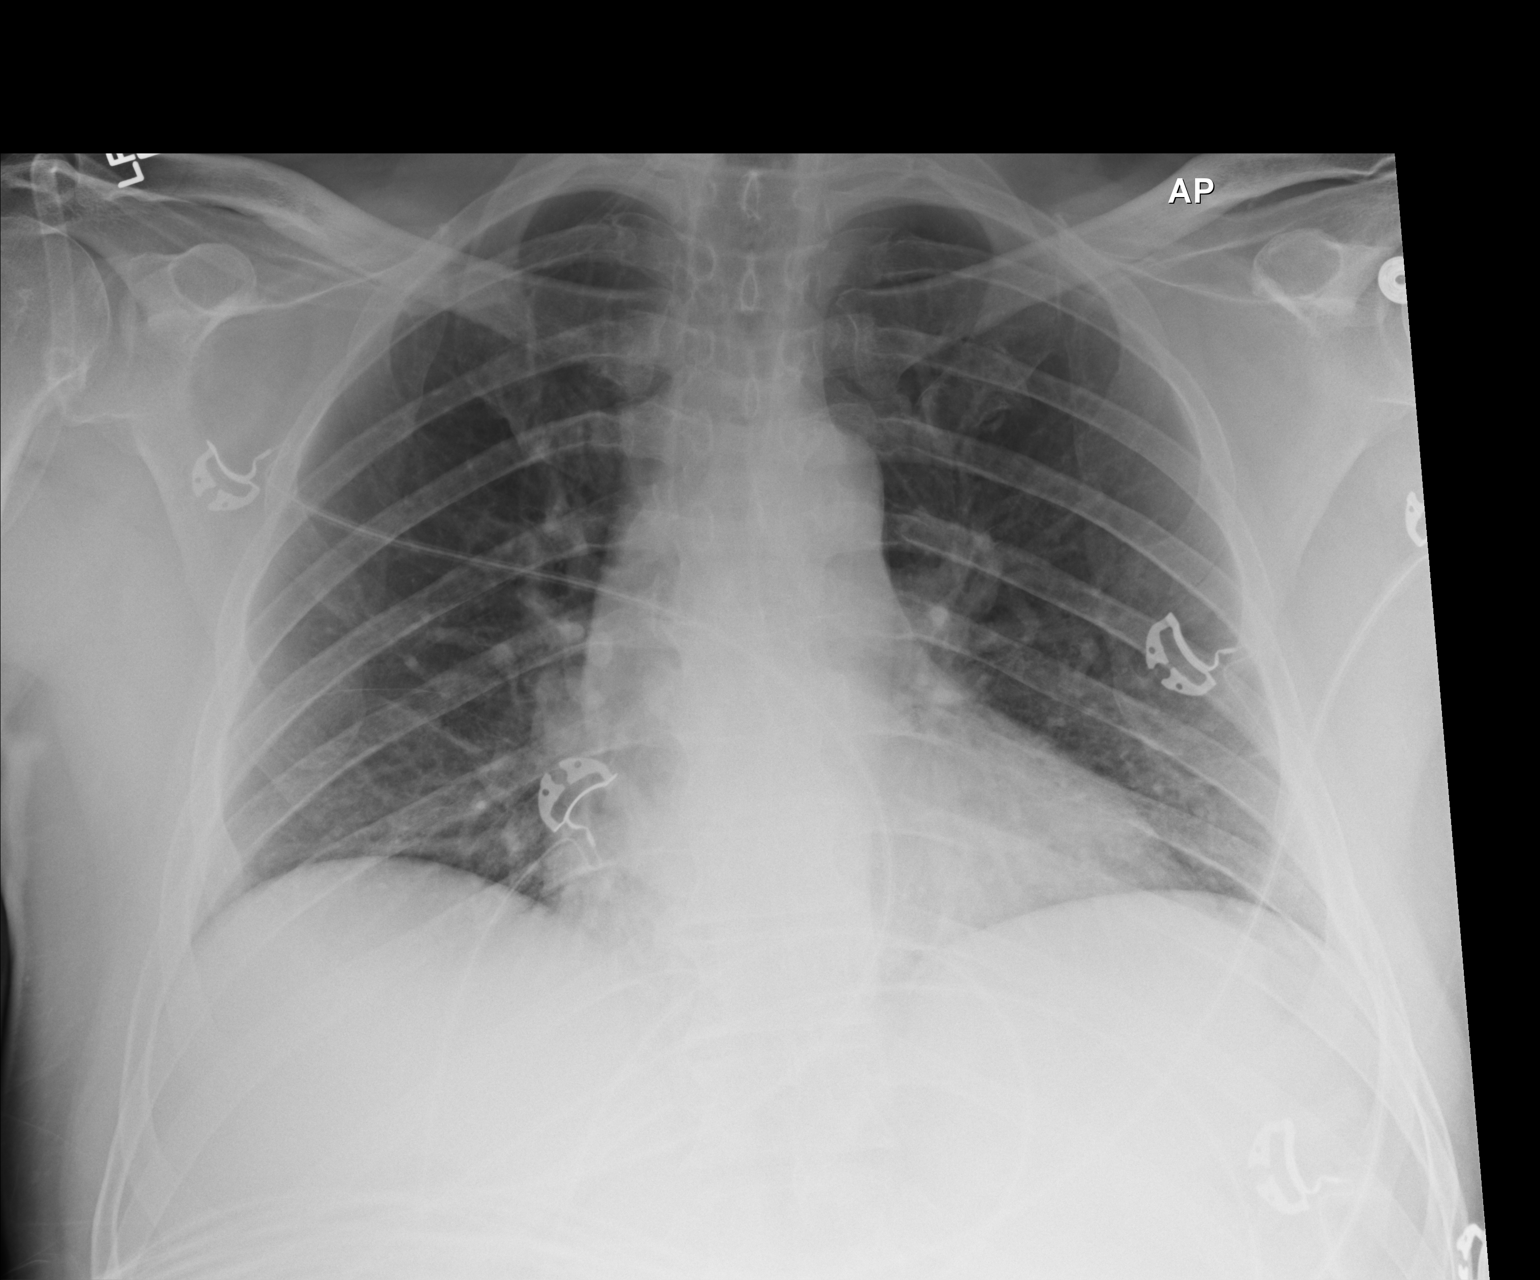

[1 of 1 positions shown; findings below may reference images not displayed]

FINDINGS: No active infiltrate or effusion is seen.  Mediastinal
contours appear normal.  The heart is within upper limits of
normal.  No acute bony abnormality is seen.
IMPRESSION: No active lung disease.  Borderline cardiomegaly.

## 2015-06-05 DIAGNOSIS — H25012 Cortical age-related cataract, left eye: Secondary | ICD-10-CM | POA: Diagnosis not present

## 2015-06-05 DIAGNOSIS — H40033 Anatomical narrow angle, bilateral: Secondary | ICD-10-CM | POA: Diagnosis not present

## 2015-06-05 DIAGNOSIS — H2512 Age-related nuclear cataract, left eye: Secondary | ICD-10-CM | POA: Diagnosis not present

## 2015-06-05 DIAGNOSIS — H04123 Dry eye syndrome of bilateral lacrimal glands: Secondary | ICD-10-CM | POA: Diagnosis not present

## 2015-06-05 DIAGNOSIS — H401431 Capsular glaucoma with pseudoexfoliation of lens, bilateral, mild stage: Secondary | ICD-10-CM | POA: Diagnosis not present

## 2015-06-05 DIAGNOSIS — H35033 Hypertensive retinopathy, bilateral: Secondary | ICD-10-CM | POA: Diagnosis not present

## 2015-07-15 DIAGNOSIS — D1801 Hemangioma of skin and subcutaneous tissue: Secondary | ICD-10-CM | POA: Diagnosis not present

## 2015-07-15 DIAGNOSIS — L821 Other seborrheic keratosis: Secondary | ICD-10-CM | POA: Diagnosis not present

## 2015-07-15 DIAGNOSIS — L570 Actinic keratosis: Secondary | ICD-10-CM | POA: Diagnosis not present

## 2015-07-31 DIAGNOSIS — Z1389 Encounter for screening for other disorder: Secondary | ICD-10-CM | POA: Diagnosis not present

## 2015-07-31 DIAGNOSIS — I10 Essential (primary) hypertension: Secondary | ICD-10-CM | POA: Diagnosis not present

## 2015-07-31 DIAGNOSIS — E7800 Pure hypercholesterolemia, unspecified: Secondary | ICD-10-CM | POA: Diagnosis not present

## 2015-07-31 DIAGNOSIS — Z23 Encounter for immunization: Secondary | ICD-10-CM | POA: Diagnosis not present

## 2015-07-31 DIAGNOSIS — R0683 Snoring: Secondary | ICD-10-CM | POA: Diagnosis not present

## 2015-07-31 DIAGNOSIS — I2510 Atherosclerotic heart disease of native coronary artery without angina pectoris: Secondary | ICD-10-CM | POA: Diagnosis not present

## 2015-07-31 DIAGNOSIS — Z125 Encounter for screening for malignant neoplasm of prostate: Secondary | ICD-10-CM | POA: Diagnosis not present

## 2015-07-31 DIAGNOSIS — Z7189 Other specified counseling: Secondary | ICD-10-CM | POA: Diagnosis not present

## 2015-07-31 DIAGNOSIS — Z1211 Encounter for screening for malignant neoplasm of colon: Secondary | ICD-10-CM | POA: Diagnosis not present

## 2015-07-31 DIAGNOSIS — R5383 Other fatigue: Secondary | ICD-10-CM | POA: Diagnosis not present

## 2015-07-31 DIAGNOSIS — Z0000 Encounter for general adult medical examination without abnormal findings: Secondary | ICD-10-CM | POA: Diagnosis not present

## 2015-08-14 DIAGNOSIS — J018 Other acute sinusitis: Secondary | ICD-10-CM | POA: Diagnosis not present

## 2015-08-21 ENCOUNTER — Encounter: Payer: Self-pay | Admitting: Interventional Cardiology

## 2015-08-21 ENCOUNTER — Ambulatory Visit (INDEPENDENT_AMBULATORY_CARE_PROVIDER_SITE_OTHER): Payer: Medicare Other | Admitting: Interventional Cardiology

## 2015-08-21 VITALS — BP 120/70 | HR 67 | Ht 69.0 in | Wt 192.0 lb

## 2015-08-21 DIAGNOSIS — I10 Essential (primary) hypertension: Secondary | ICD-10-CM | POA: Diagnosis not present

## 2015-08-21 DIAGNOSIS — E785 Hyperlipidemia, unspecified: Secondary | ICD-10-CM

## 2015-08-21 DIAGNOSIS — I2510 Atherosclerotic heart disease of native coronary artery without angina pectoris: Secondary | ICD-10-CM | POA: Diagnosis not present

## 2015-08-21 DIAGNOSIS — I252 Old myocardial infarction: Secondary | ICD-10-CM

## 2015-08-21 NOTE — Patient Instructions (Signed)

## 2015-08-21 NOTE — Progress Notes (Signed)
Cardiology Office Note   Date:  08/21/2015   ID:  Mario [name], DOB Sep 16, 1945, MRN [id number]  PCP:  Mario Shelling, MD    No chief complaint on file. f/u CAD   Wt Readings from Last 3 Encounters:  08/21/15 192 lb (87.091 kg)  08/20/14 194 lb (87.998 kg)  08/02/13 193 lb 1.9 oz (87.599 kg)       History of Present Illness: ATO [name] is a 70 y.o. male  who had an anterior MI, and BMS to the LAD in 2007-  Main sx was back pain and got pale.  He had a cardiac cath in 2012 showing a patent LAD stent. BP not checked much. He did not check his BP on the lower metoprolol. He felt fatigue in metoprolol. He takes a nap in the evening and feels better.  At other MD visits, his BP has been well controlled.    He recently helped his brother move and he did strenuous exercise without any problems. He is due for a sleep study.     Past Medical History  Diagnosis Date  . Coronary artery disease   . Myocardial infarct ([place])   . Old myocardial infarction   . Essential hypertension, benign     Past Surgical History  Procedure Laterality Date  . Coronary stent placement    . Rotator cuff surgery Bilateral      Current Outpatient Prescriptions  Medication Sig Dispense Refill  . aspirin EC 81 MG tablet Take 81 mg by mouth daily.    Marland Kitchen atorvastatin (LIPITOR) 40 MG tablet Take 1 tablet (40 mg total) by mouth daily. 90 tablet 3  . dorzolamide (TRUSOPT) 2 % ophthalmic solution PLACE 1 DROP IN EACH EYE EVERY DAY  4  . fluticasone (FLONASE) 50 MCG/ACT nasal spray USE 2 SPRAYS IN EACH NOSTRIL DAILY AT NIGHT  5  . metoprolol tartrate (LOPRESSOR) 25 MG tablet TAKE 1 TABLET BY MOUTH TWICE A DAY 180 tablet 3  . nitroGLYCERIN (NITROSTAT) 0.4 MG SL tablet Place 0.4 mg under the tongue every 5 (five) minutes as needed for chest pain (UP TO 3 DOSES BEFORE CALLING 911).    . TRAVATAN Z 0.004 % SOLN ophthalmic solution Place 1 drop into both eyes at bedtime.   2  . valsartan (DIOVAN)  160 MG tablet Take 160 mg by mouth every morning.     No current facility-administered medications for this visit.    Allergies:   Zicam cold remedy and Sulfa antibiotics    Social History:  The patient  reports that he has never smoked. He does not have any smokeless tobacco history on file. He reports that he does not drink alcohol or use illicit drugs.   Family History:  The patient's family history includes CAD in his father; CVA in his father; Heart attack in his brother and father; Hypertension in his brother; Lung cancer in his mother.    ROS:  Please see the history of present illness.   Otherwise, review of systems are positive for .   All other systems are reviewed and negative.    PHYSICAL EXAM: VS:  BP 120/70 mmHg  Pulse 67  Ht 5\' 9"  (1.753 m)  Wt 192 lb (87.091 kg)  BMI 28.34 kg/m2 , BMI Body mass index is 28.34 kg/(m^2). GEN: Well nourished, well developed, in no acute distress HEENT: normal Neck: no JVD, carotid bruits, or masses Cardiac: RRR; no murmurs, rubs, or gallops,no edema  Respiratory:  clear to auscultation bilaterally, normal work of breathing GI: soft, nontender, nondistended, + BS MS: no deformity or atrophy Skin: warm and dry, no rash Neuro:  Strength and sensation are intact Psych: euthymic mood, full affect   EKG:   The ekg ordered today demonstrates NSR, nonspecific inferior ST changes inferiorly- no change from 2016   Recent Labs: 08/31/2014: ALT 17   Lipid Panel    Component Value Date/Time   CHOL 100 08/31/2014 0845   TRIG 126.0 08/31/2014 0845   HDL 35.00* 08/31/2014 0845   CHOLHDL 3 08/31/2014 0845   VLDL 25.2 08/31/2014 0845   LDLCALC 40 08/31/2014 0845     Other studies Reviewed: Additional studies/ records that were reviewed today with results demonstrating: 2011 echo: normal LV function. 2014 nuclear stress: normal- 7 minutes on the treadmill   ASSESSMENT AND PLAN:  1. CAD/old MI : No angina.  S/p BMS in the LAD in 2007.  He needs to get into a regular pattern of exercise. He'll be slowing down his activity due to retiring. Please see below. No CHF symptoms. If he has symptoms that remind him of his prior MI, he should contact our office. 2. Hyperlipidemia: Lipids well controlled. Continue current lipid-lowering therapy. 3. HTN: Blood pressure well controlled. Continue current medications.   Current medicines are reviewed at length with the patient today.  The patient concerns regarding his medicines were addressed.  The following changes have been made:  He had been taking aspirin 325 mg temporarily. He can decrease this back to 81 mg daily.  Labs/ tests ordered today include:  Orders Placed This Encounter  Procedures  . EKG 12-Lead    Recommend 150 minutes/week of aerobic exercise Low fat, low carb, high fiber diet recommended  Disposition:   FU in 1 year   Signed, Mario Grooms, MD  08/21/2015 11:15 AM    [address], [place], [place]  [address] Phone: [phone]; Fax: [phone]

## 2015-11-09 ENCOUNTER — Other Ambulatory Visit: Payer: Self-pay | Admitting: Interventional Cardiology

## 2016-01-07 ENCOUNTER — Other Ambulatory Visit: Payer: Self-pay | Admitting: Interventional Cardiology

## 2016-02-03 ENCOUNTER — Other Ambulatory Visit: Payer: Self-pay | Admitting: Gastroenterology

## 2016-02-06 ENCOUNTER — Other Ambulatory Visit: Payer: Self-pay | Admitting: Gastroenterology

## 2016-02-24 DIAGNOSIS — Z23 Encounter for immunization: Secondary | ICD-10-CM | POA: Diagnosis not present

## 2016-03-16 ENCOUNTER — Ambulatory Visit (HOSPITAL_COMMUNITY): Admit: 2016-03-16 | Payer: 59 | Admitting: Gastroenterology

## 2016-03-16 ENCOUNTER — Encounter (HOSPITAL_COMMUNITY): Payer: Self-pay

## 2016-03-16 SURGERY — COLONOSCOPY WITH PROPOFOL
Anesthesia: Monitor Anesthesia Care

## 2016-04-16 DIAGNOSIS — H9071 Mixed conductive and sensorineural hearing loss, unilateral, right ear, with unrestricted hearing on the contralateral side: Secondary | ICD-10-CM | POA: Diagnosis not present

## 2016-04-16 DIAGNOSIS — J3089 Other allergic rhinitis: Secondary | ICD-10-CM | POA: Diagnosis not present

## 2016-04-16 DIAGNOSIS — H60333 Swimmer's ear, bilateral: Secondary | ICD-10-CM | POA: Diagnosis not present

## 2016-05-29 DIAGNOSIS — H401431 Capsular glaucoma with pseudoexfoliation of lens, bilateral, mild stage: Secondary | ICD-10-CM | POA: Diagnosis not present

## 2016-06-11 DIAGNOSIS — H2513 Age-related nuclear cataract, bilateral: Secondary | ICD-10-CM | POA: Diagnosis not present

## 2016-06-11 DIAGNOSIS — H401491 Capsular glaucoma with pseudoexfoliation of lens, unspecified eye, mild stage: Secondary | ICD-10-CM | POA: Diagnosis not present

## 2016-06-11 DIAGNOSIS — H401431 Capsular glaucoma with pseudoexfoliation of lens, bilateral, mild stage: Secondary | ICD-10-CM | POA: Diagnosis not present

## 2016-06-11 DIAGNOSIS — I10 Essential (primary) hypertension: Secondary | ICD-10-CM | POA: Diagnosis not present

## 2016-07-09 ENCOUNTER — Other Ambulatory Visit: Payer: Self-pay | Admitting: Interventional Cardiology

## 2016-07-20 ENCOUNTER — Other Ambulatory Visit: Payer: Self-pay | Admitting: *Deleted

## 2016-07-20 MED ORDER — METOPROLOL TARTRATE 25 MG PO TABS: 25.0000 mg | ORAL_TABLET | Freq: Two times a day (BID) | ORAL | 0 refills | Status: DC

## 2016-07-23 ENCOUNTER — Other Ambulatory Visit: Payer: Self-pay | Admitting: Interventional Cardiology

## 2016-07-23 DIAGNOSIS — I10 Essential (primary) hypertension: Secondary | ICD-10-CM | POA: Diagnosis not present

## 2016-07-23 DIAGNOSIS — H2513 Age-related nuclear cataract, bilateral: Secondary | ICD-10-CM | POA: Diagnosis not present

## 2016-07-23 DIAGNOSIS — H401431 Capsular glaucoma with pseudoexfoliation of lens, bilateral, mild stage: Secondary | ICD-10-CM | POA: Diagnosis not present

## 2016-08-03 ENCOUNTER — Other Ambulatory Visit: Payer: Self-pay | Admitting: *Deleted

## 2016-08-03 MED ORDER — METOPROLOL TARTRATE 25 MG PO TABS: 25.0000 mg | ORAL_TABLET | Freq: Two times a day (BID) | ORAL | 0 refills | Status: DC

## 2016-08-03 MED ORDER — VALSARTAN 160 MG PO TABS: 160.0000 mg | ORAL_TABLET | Freq: Every morning | ORAL | 0 refills | Status: DC

## 2016-08-03 MED ORDER — ATORVASTATIN CALCIUM 40 MG PO TABS: 40.0000 mg | ORAL_TABLET | Freq: Every day | ORAL | 0 refills | Status: DC

## 2016-08-04 ENCOUNTER — Encounter: Payer: Self-pay | Admitting: Interventional Cardiology

## 2016-08-19 NOTE — Progress Notes (Signed)
Cardiology Office Note   Date:  08/20/2016   ID:  Kinte, Trim 1946/02/02, MRN [id number]  PCP:  Lavone Orn, MD    No chief complaint on file. f/u CAD   Wt Readings from Last 3 Encounters:  08/20/16 190 lb 3.2 oz (86.3 kg)  08/21/15 192 lb (87.1 kg)  08/20/14 194 lb (88 kg)       History of Present Illness: Mario [name] is a 71 y.o. male  who had an anterior MI, and BMS to the LAD in 2007-  Main sx was back pain and got pale.  He had a cardiac cath in 2012 showing a patent LAD stent. Negative stress test in 2014.   In the past, felt fatigue on metoprolol. He takes a nap in the evening and feels better.  At other MD visits, his BP has been well controlled.    THis AM, BP was 124/72.  He had some up and down readings with family stress.  He had a brother - in - lawdie from colon cancer, and another brother had lung cancer in the past year.  THe brother was at the [place]; they stayed  at a hotel room in [place].  THe brother passed away on 08/16/16.  Denies : Chest pain. Dizziness. Leg edema. Nitroglycerin use. Orthopnea. Palpitations. Paroxysmal nocturnal dyspnea. Shortness of breath. Syncope.   He has not been exercising regularly.       Past Medical History:  Diagnosis Date  . Coronary artery disease   . Essential hypertension, benign   . Myocardial infarct ([place])   . Old myocardial infarction     Past Surgical History:  Procedure Laterality Date  . CORONARY STENT PLACEMENT    . rotator cuff surgery Bilateral      Current Outpatient Prescriptions  Medication Sig Dispense Refill  . aspirin EC 81 MG tablet Take 81 mg by mouth daily.    Marland Kitchen atorvastatin (LIPITOR) 40 MG tablet Take 1 tablet (40 mg total) by mouth daily. 30 tablet 0  . dorzolamide (TRUSOPT) 2 % ophthalmic solution PLACE 1 DROP IN EACH EYE EVERY DAY  4  . metoprolol tartrate (LOPRESSOR) 25 MG tablet Take 1 tablet (25 mg total) by mouth 2 (two) times daily. 60 tablet 0  . nitroGLYCERIN  (NITROSTAT) 0.4 MG SL tablet Place 0.4 mg under the tongue every 5 (five) minutes as needed for chest pain (UP TO 3 DOSES BEFORE CALLING 911).    . timolol (TIMOPTIC) 0.5 % ophthalmic solution Place 1 drop into both eyes 2 (two) times daily.  1  . TRAVATAN Z 0.004 % SOLN ophthalmic solution Place 1 drop into both eyes at bedtime.   2  . valsartan (DIOVAN) 160 MG tablet Take 1 tablet (160 mg total) by mouth every morning. 30 tablet 0  . fluticasone (FLONASE) 50 MCG/ACT nasal spray USE 2 SPRAYS IN EACH NOSTRIL DAILY AT NIGHT  5   No current facility-administered medications for this visit.     Allergies:   Zicam cold remedy [erysidoron #1] and Sulfa antibiotics    Social History:  The patient  reports that he has never smoked. He has never used smokeless tobacco. He reports that he does not drink alcohol or use drugs.   Family History:  The patient's family history includes CAD in his father; CVA in his father; Heart attack in his brother and father; Hypertension in his brother; Lung cancer in his mother.    ROS:  Please see the history of present illness.   Otherwise, review of systems are positive for joint pains.   All other systems are reviewed and negative.    PHYSICAL EXAM: VS:  BP 120/80 (BP Location: Right Arm, Patient Position: Sitting, Cuff Size: Normal)   Pulse 66   Ht 5\' 9"  (1.753 m)   Wt 190 lb 3.2 oz (86.3 kg)   SpO2 98%   BMI 28.09 kg/m  , BMI Body mass index is 28.09 kg/m. GEN: Well nourished, well developed, in no acute distress  HEENT: normal  Neck: no JVD, carotid bruits, or masses Cardiac: RRR; no murmurs, rubs, or gallops,no edema  Respiratory:  clear to auscultation bilaterally, normal work of breathing GI: soft, nontender, nondistended,  MS: no deformity or atrophy  Skin: warm and dry, no rash Neuro:  Strength and sensation are intact Psych: euthymic mood, full affect     EKG:   The ekg ordered today demonstrates NSR, inferior/lateral  ST changes- no  change from 2017   Recent Labs: No results found for requested labs within last 8760 hours.   Lipid Panel    Component Value Date/Time   CHOL 100 08/31/2014 0845   TRIG 126.0 08/31/2014 0845   HDL 35.00 (L) 08/31/2014 0845   CHOLHDL 3 08/31/2014 0845   VLDL 25.2 08/31/2014 0845   LDLCALC 40 08/31/2014 0845     Other studies Reviewed: Additional studies/ records that were reviewed today with results demonstrating: 2011 echo: normal LV function. 2014 nuclear stress: normal- 7 minutes on the treadmill; prior CT scan showed no AAA.   ASSESSMENT AND PLAN:  1. CAD/old MI : No angina on medical therapy.  PCI in 2007.  He needs to get into a regular pattern of exercise. He still has not retired, planning on going back.  No CHF sx.  2. Hyperlipidemia: Lipids were well controlled.  He missed his appt with Dr. Laurann [place]. Diet could improve, especially while he was out of town. 3. HTN: BP now better controlled after stress improved. Continue current meds.   Current medicines are reviewed at length with the patient today.  The patient concerns regarding his medicines were addressed.  The following changes have been made:  None  Labs/ tests ordered today include:  No orders of the defined types were placed in this encounter.   Recommend 150 minutes/week of aerobic exercise Low fat, low carb, high fiber diet recommended  Disposition:   FU in 1 year   Signed, Larae Grooms, MD  08/20/2016 9:57 AM    [facility] [address], [place], [place]  [address] Phone: [phone]; Fax: [phone]

## 2016-08-20 ENCOUNTER — Ambulatory Visit (INDEPENDENT_AMBULATORY_CARE_PROVIDER_SITE_OTHER): Payer: Medicare Other | Admitting: Interventional Cardiology

## 2016-08-20 ENCOUNTER — Encounter: Payer: Self-pay | Admitting: Interventional Cardiology

## 2016-08-20 VITALS — BP 120/80 | HR 66 | Ht 69.0 in | Wt 190.2 lb

## 2016-08-20 DIAGNOSIS — I25119 Atherosclerotic heart disease of native coronary artery with unspecified angina pectoris: Secondary | ICD-10-CM | POA: Diagnosis not present

## 2016-08-20 DIAGNOSIS — I10 Essential (primary) hypertension: Secondary | ICD-10-CM | POA: Diagnosis not present

## 2016-08-20 DIAGNOSIS — E782 Mixed hyperlipidemia: Secondary | ICD-10-CM | POA: Diagnosis not present

## 2016-08-20 DIAGNOSIS — I252 Old myocardial infarction: Secondary | ICD-10-CM | POA: Diagnosis not present

## 2016-08-20 MED ORDER — NITROGLYCERIN 0.4 MG SL SUBL: 0.4000 mg | SUBLINGUAL_TABLET | SUBLINGUAL | 3 refills | Status: DC | PRN

## 2016-08-20 MED ORDER — METOPROLOL TARTRATE 25 MG PO TABS: 25.0000 mg | ORAL_TABLET | Freq: Two times a day (BID) | ORAL | 0 refills | Status: DC

## 2016-08-20 MED ORDER — VALSARTAN 160 MG PO TABS: 160.0000 mg | ORAL_TABLET | Freq: Every morning | ORAL | 0 refills | Status: DC

## 2016-08-20 MED ORDER — ATORVASTATIN CALCIUM 40 MG PO TABS: 40.0000 mg | ORAL_TABLET | Freq: Every day | ORAL | 0 refills | Status: DC

## 2016-08-20 MED ORDER — ASPIRIN EC 81 MG PO TBEC: 81.0000 mg | DELAYED_RELEASE_TABLET | Freq: Every day | ORAL | 3 refills | Status: DC

## 2016-08-20 NOTE — Patient Instructions (Signed)

## 2016-08-28 DIAGNOSIS — I2510 Atherosclerotic heart disease of native coronary artery without angina pectoris: Secondary | ICD-10-CM | POA: Diagnosis not present

## 2016-08-28 DIAGNOSIS — E291 Testicular hypofunction: Secondary | ICD-10-CM | POA: Diagnosis not present

## 2016-08-28 DIAGNOSIS — Z1211 Encounter for screening for malignant neoplasm of colon: Secondary | ICD-10-CM | POA: Diagnosis not present

## 2016-08-28 DIAGNOSIS — E7800 Pure hypercholesterolemia, unspecified: Secondary | ICD-10-CM | POA: Diagnosis not present

## 2016-08-28 DIAGNOSIS — Z125 Encounter for screening for malignant neoplasm of prostate: Secondary | ICD-10-CM | POA: Diagnosis not present

## 2016-08-28 DIAGNOSIS — Z0000 Encounter for general adult medical examination without abnormal findings: Secondary | ICD-10-CM | POA: Diagnosis not present

## 2016-08-28 DIAGNOSIS — I10 Essential (primary) hypertension: Secondary | ICD-10-CM | POA: Diagnosis not present

## 2016-08-28 DIAGNOSIS — R0683 Snoring: Secondary | ICD-10-CM | POA: Diagnosis not present

## 2016-08-28 DIAGNOSIS — Z1389 Encounter for screening for other disorder: Secondary | ICD-10-CM | POA: Diagnosis not present

## 2016-08-31 ENCOUNTER — Other Ambulatory Visit: Payer: Self-pay | Admitting: Interventional Cardiology

## 2016-09-20 ENCOUNTER — Other Ambulatory Visit: Payer: Self-pay | Admitting: Interventional Cardiology

## 2016-09-28 DIAGNOSIS — L570 Actinic keratosis: Secondary | ICD-10-CM | POA: Diagnosis not present

## 2016-09-28 DIAGNOSIS — L821 Other seborrheic keratosis: Secondary | ICD-10-CM | POA: Diagnosis not present

## 2016-09-28 DIAGNOSIS — D1801 Hemangioma of skin and subcutaneous tissue: Secondary | ICD-10-CM | POA: Diagnosis not present

## 2016-09-28 DIAGNOSIS — L718 Other rosacea: Secondary | ICD-10-CM | POA: Diagnosis not present

## 2016-09-28 DIAGNOSIS — L738 Other specified follicular disorders: Secondary | ICD-10-CM | POA: Diagnosis not present

## 2016-09-28 DIAGNOSIS — L820 Inflamed seborrheic keratosis: Secondary | ICD-10-CM | POA: Diagnosis not present

## 2016-10-12 ENCOUNTER — Other Ambulatory Visit: Payer: Self-pay | Admitting: Interventional Cardiology

## 2016-10-12 NOTE — Telephone Encounter (Signed)
Medication Detail    Disp Refills Start End   metoprolol tartrate (LOPRESSOR) 25 MG tablet 60 tablet 11 08/31/2016    Sig: TAKE 1 TABLET BY MOUTH TWICE A DAY   Sent to pharmacy as: metoprolol tartrate (LOPRESSOR) 25 MG tablet   E-Prescribing Status: Receipt confirmed by pharmacy (08/31/2016 4:51 PM EDT)   Pharmacy   [id number] - [place], [place] - [address] AT [place]

## 2016-10-19 ENCOUNTER — Other Ambulatory Visit: Payer: Self-pay

## 2016-10-19 MED ORDER — METOPROLOL TARTRATE 25 MG PO TABS: 25.0000 mg | ORAL_TABLET | Freq: Two times a day (BID) | ORAL | 0 refills | Status: DC

## 2016-10-21 ENCOUNTER — Other Ambulatory Visit: Payer: Self-pay | Admitting: Interventional Cardiology

## 2017-01-04 DIAGNOSIS — Z23 Encounter for immunization: Secondary | ICD-10-CM | POA: Diagnosis not present

## 2017-01-21 ENCOUNTER — Other Ambulatory Visit: Payer: Self-pay | Admitting: Interventional Cardiology

## 2017-02-22 DIAGNOSIS — R200 Anesthesia of skin: Secondary | ICD-10-CM | POA: Diagnosis not present

## 2017-02-24 DIAGNOSIS — M5412 Radiculopathy, cervical region: Secondary | ICD-10-CM | POA: Diagnosis not present

## 2017-02-24 DIAGNOSIS — G5602 Carpal tunnel syndrome, left upper limb: Secondary | ICD-10-CM | POA: Diagnosis not present

## 2017-02-24 DIAGNOSIS — G8929 Other chronic pain: Secondary | ICD-10-CM | POA: Diagnosis not present

## 2017-03-03 ENCOUNTER — Other Ambulatory Visit: Payer: Self-pay | Admitting: Orthopedic Surgery

## 2017-03-03 ENCOUNTER — Telehealth: Payer: Self-pay | Admitting: Interventional Cardiology

## 2017-03-03 NOTE — Telephone Encounter (Signed)
° °  [facility] Pre-operative Risk Assessment    Request for surgical clearance:  1. What type of surgery is being performed? Left carpal tunnel release  2. When is this surgery scheduled? TBD  3. Are there any medications that need to be held prior to surgery and how long? If there are any medications to be held, please inform us.  4. Practice name and name of physician performing surgery? [facility], Dr. Daryll Brod  5. What is your office phone and fax number? PH# [phone], FAX# [place] [phone] ATTN: Cyndee Brightly, RN  6. Anesthesia type (None, local, MAC, general) ? IV regional forearm block    Derl Barrow 03/03/2017, 9:18 AM  _________________________________________________________________   (provider comments below)

## 2017-03-03 NOTE — Telephone Encounter (Signed)
   Primary Cardiologist: No primary care provider on file.  Chart reviewed as part of pre-operative protocol coverage. Given past medical history and time since last visit, based on ACC/AHA guidelinesJames F Sek would be at acceptable risk for the planned procedure without further cardiovascular testing.       I will route this recommendation to the requesting party via Epic fax function and remove from pre-op pool.  Please call with questions.  Kerin Ransom, PA-C 03/03/2017, 3:45 PM

## 2017-03-04 ENCOUNTER — Telehealth: Payer: Self-pay

## 2017-03-04 NOTE — Telephone Encounter (Signed)
   [facility] Pre-operative Risk Assessment    Request for surgical clearance:  1. What type of surgery is being performed? LEFT CARPAL TUNNEL RELEASE  2. When is this surgery scheduled? 03/16/2017  3. Are there any medications that need to be held prior to surgery and how long? Patient is currently on aspirin.  Patient has stent. Please advise on recommendations for aspirin.  4. Practice name and name of physician performing surgery? [facility]. Dr. Daryll Brod   5. What is your office phone and fax number? P: [phone] F: [phone] ATTNCyndee Brightly RN  6. Anesthesia type (None, local, MAC, general) ? IV REGIONAL FOREARM BLOCK   Stephannie Peters 03/04/2017, 3:05 PM  _________________________________________________________________   (provider comments below)

## 2017-03-06 NOTE — Telephone Encounter (Signed)
   Chart reviewed as part of pre-operative protocol coverage.  Kerin Ransom PA-C already cleared patient for surgery in prior note, but do not see recs forwarded regarding aspirin. Will forward to Dr. Irish Lack - last PCI appears to be in 2007, please advise on how long is acceptable to hold (if allowed at all) prior to procedure. Dr. Clayton Bibles, please route your response to P CV DIV PREOP.  Charlie Pitter, PA-C 03/06/2017, 4:48 PM

## 2017-03-10 NOTE — Telephone Encounter (Signed)
   Chart reviewed as part of pre-operative protocol coverage. Pre-op clearance already addressed by colleagues -Kerin Ransom PA-C already cleared patient for surgery in prior note . To summarize Dr. Hassell Done recommendation regarding aspirin, "OK to hold aspirin 7 days prior to surgery if necessary.  If surgeon is ok doing procedure on aspirin, that is ok with me."  Will route this bundled recommendation to requesting provider via Epic fax function. Please call with questions.  Charlie Pitter, PA-C 03/10/2017, 11:03 AM

## 2017-03-10 NOTE — Telephone Encounter (Signed)
OK to hold aspirin 7 days prior to surgery if necessary.  If surgeon is ok doing procedure on aspirin, that is ok with me.

## 2017-03-12 ENCOUNTER — Other Ambulatory Visit: Payer: Self-pay

## 2017-03-12 ENCOUNTER — Encounter (HOSPITAL_BASED_OUTPATIENT_CLINIC_OR_DEPARTMENT_OTHER): Payer: Self-pay | Admitting: *Deleted

## 2017-03-16 ENCOUNTER — Encounter (HOSPITAL_BASED_OUTPATIENT_CLINIC_OR_DEPARTMENT_OTHER)
Admission: RE | Disposition: A | Discharge status: Home / Self Care | Payer: Self-pay | Source: Ambulatory Visit | Attending: Orthopedic Surgery

## 2017-03-16 ENCOUNTER — Ambulatory Visit (HOSPITAL_BASED_OUTPATIENT_CLINIC_OR_DEPARTMENT_OTHER): Payer: Medicare Other | Admitting: Anesthesiology

## 2017-03-16 ENCOUNTER — Encounter (HOSPITAL_BASED_OUTPATIENT_CLINIC_OR_DEPARTMENT_OTHER): Payer: Self-pay | Admitting: Anesthesiology

## 2017-03-16 ENCOUNTER — Other Ambulatory Visit: Payer: Self-pay

## 2017-03-16 ENCOUNTER — Ambulatory Visit (HOSPITAL_BASED_OUTPATIENT_CLINIC_OR_DEPARTMENT_OTHER)
Admission: RE | Admit: 2017-03-16 | Discharge: 2017-03-16 | Disposition: A | Discharge status: Home / Self Care | Payer: Medicare Other | Source: Ambulatory Visit | Attending: Orthopedic Surgery | Admitting: Orthopedic Surgery

## 2017-03-16 DIAGNOSIS — E785 Hyperlipidemia, unspecified: Secondary | ICD-10-CM | POA: Diagnosis not present

## 2017-03-16 DIAGNOSIS — I2510 Atherosclerotic heart disease of native coronary artery without angina pectoris: Secondary | ICD-10-CM | POA: Insufficient documentation

## 2017-03-16 DIAGNOSIS — H409 Unspecified glaucoma: Secondary | ICD-10-CM | POA: Insufficient documentation

## 2017-03-16 DIAGNOSIS — Z8249 Family history of ischemic heart disease and other diseases of the circulatory system: Secondary | ICD-10-CM | POA: Diagnosis not present

## 2017-03-16 DIAGNOSIS — G5602 Carpal tunnel syndrome, left upper limb: Secondary | ICD-10-CM | POA: Diagnosis not present

## 2017-03-16 DIAGNOSIS — I10 Essential (primary) hypertension: Secondary | ICD-10-CM | POA: Diagnosis not present

## 2017-03-16 DIAGNOSIS — I252 Old myocardial infarction: Secondary | ICD-10-CM | POA: Diagnosis not present

## 2017-03-16 DIAGNOSIS — Z823 Family history of stroke: Secondary | ICD-10-CM | POA: Insufficient documentation

## 2017-03-16 HISTORY — DX: Unspecified glaucoma: H40.9

## 2017-03-16 HISTORY — PX: CARPAL TUNNEL RELEASE: SHX101

## 2017-03-16 SURGERY — CARPAL TUNNEL RELEASE
Anesthesia: Regional | Laterality: Left

## 2017-03-16 MED ORDER — CEFAZOLIN SODIUM-DEXTROSE 2-4 GM/100ML-% IV SOLN
INTRAVENOUS | Status: AC
  Filled 2017-03-16: qty 100

## 2017-03-16 MED ORDER — HYDROCODONE-ACETAMINOPHEN 5-325 MG PO TABS: 1.0000 | ORAL_TABLET | Freq: Four times a day (QID) | ORAL | 0 refills | Status: DC | PRN

## 2017-03-16 MED ORDER — PROPOFOL 500 MG/50ML IV EMUL
INTRAVENOUS | Status: AC
  Filled 2017-03-16: qty 50

## 2017-03-16 MED ORDER — BUPIVACAINE HCL (PF) 0.25 % IJ SOLN
INTRAMUSCULAR | Status: DC | PRN
  Administered 2017-03-16: 7 mL

## 2017-03-16 MED ORDER — CEFAZOLIN SODIUM-DEXTROSE 2-4 GM/100ML-% IV SOLN
2.0000 g | INTRAVENOUS | Status: AC
  Administered 2017-03-16 (×2): 2 g via INTRAVENOUS

## 2017-03-16 MED ORDER — FENTANYL CITRATE (PF) 100 MCG/2ML IJ SOLN
INTRAMUSCULAR | Status: AC
Start: 2017-03-16 — End: ?
  Filled 2017-03-16: qty 2

## 2017-03-16 MED ORDER — MIDAZOLAM HCL 2 MG/2ML IJ SOLN: 1.0000 mg | INTRAMUSCULAR | Status: DC | PRN

## 2017-03-16 MED ORDER — FENTANYL CITRATE (PF) 100 MCG/2ML IJ SOLN: 25.0000 ug | INTRAMUSCULAR | Status: DC | PRN

## 2017-03-16 MED ORDER — LIDOCAINE 2% (20 MG/ML) 5 ML SYRINGE
INTRAMUSCULAR | Status: AC
  Filled 2017-03-16: qty 5

## 2017-03-16 MED ORDER — SCOPOLAMINE 1 MG/3DAYS TD PT72: 1.0000 | MEDICATED_PATCH | Freq: Once | TRANSDERMAL | Status: DC | PRN

## 2017-03-16 MED ORDER — LACTATED RINGERS IV SOLN
INTRAVENOUS | Status: DC
  Administered 2017-03-16 (×2): via INTRAVENOUS

## 2017-03-16 MED ORDER — LIDOCAINE HCL (PF) 0.5 % IJ SOLN
INTRAMUSCULAR | Status: DC | PRN
  Administered 2017-03-16: 35 mL via INTRAVENOUS

## 2017-03-16 MED ORDER — PROMETHAZINE HCL 25 MG/ML IJ SOLN: 6.2500 mg | INTRAMUSCULAR | Status: DC | PRN

## 2017-03-16 MED ORDER — PROPOFOL 500 MG/50ML IV EMUL
INTRAVENOUS | Status: DC | PRN
  Administered 2017-03-16: 75 ug/kg/min via INTRAVENOUS

## 2017-03-16 MED ORDER — FENTANYL CITRATE (PF) 100 MCG/2ML IJ SOLN
50.0000 ug | INTRAMUSCULAR | Status: AC | PRN
  Administered 2017-03-16 (×2): 25 ug via INTRAVENOUS
  Administered 2017-03-16: 50 ug via INTRAVENOUS

## 2017-03-16 MED ORDER — CHLORHEXIDINE GLUCONATE 4 % EX LIQD: 60.0000 mL | Freq: Once | CUTANEOUS | Status: DC

## 2017-03-16 MED ORDER — ONDANSETRON HCL 4 MG/2ML IJ SOLN
INTRAMUSCULAR | Status: AC
  Filled 2017-03-16: qty 2

## 2017-03-16 SURGICAL SUPPLY — 33 items
BLADE SURG 15 STRL LF DISP TIS (BLADE) ×1 IMPLANT
BLADE SURG 15 STRL SS (BLADE) ×3
BNDG CMPR 9X4 STRL LF SNTH (GAUZE/BANDAGES/DRESSINGS) ×1
BNDG COHESIVE 3X5 TAN STRL LF (GAUZE/BANDAGES/DRESSINGS) ×3 IMPLANT
BNDG ESMARK 4X9 LF (GAUZE/BANDAGES/DRESSINGS) ×2 IMPLANT
BNDG GAUZE ELAST 4 BULKY (GAUZE/BANDAGES/DRESSINGS) ×3 IMPLANT
CHLORAPREP W/TINT 26ML (MISCELLANEOUS) ×3 IMPLANT
CORD BIPOLAR FORCEPS 12FT (ELECTRODE) ×3 IMPLANT
COVER BACK TABLE 60X90IN (DRAPES) ×3 IMPLANT
COVER MAYO STAND STRL (DRAPES) ×3 IMPLANT
CUFF TOURNIQUET SINGLE 18IN (TOURNIQUET CUFF) ×3 IMPLANT
DRAPE EXTREMITY T 121X128X90 (DRAPE) ×3 IMPLANT
DRAPE SURG 17X23 STRL (DRAPES) ×3 IMPLANT
DRSG PAD ABDOMINAL 8X10 ST (GAUZE/BANDAGES/DRESSINGS) ×3 IMPLANT
GAUZE SPONGE 4X4 12PLY STRL (GAUZE/BANDAGES/DRESSINGS) ×3 IMPLANT
GAUZE XEROFORM 1X8 LF (GAUZE/BANDAGES/DRESSINGS) ×3 IMPLANT
GLOVE BIOGEL PI IND STRL 8.5 (GLOVE) ×1 IMPLANT
GLOVE BIOGEL PI INDICATOR 8.5 (GLOVE) ×2
GLOVE SURG ORTHO 8.0 STRL STRW (GLOVE) ×3 IMPLANT
GOWN STRL REUS W/ TWL LRG LVL3 (GOWN DISPOSABLE) ×1 IMPLANT
GOWN STRL REUS W/TWL LRG LVL3 (GOWN DISPOSABLE) ×3
GOWN STRL REUS W/TWL XL LVL3 (GOWN DISPOSABLE) ×3 IMPLANT
NDL PRECISIONGLIDE 27X1.5 (NEEDLE) IMPLANT
NEEDLE PRECISIONGLIDE 27X1.5 (NEEDLE) IMPLANT
NS IRRIG 1000ML POUR BTL (IV SOLUTION) ×3 IMPLANT
PACK BASIN DAY SURGERY FS (CUSTOM PROCEDURE TRAY) ×3 IMPLANT
STOCKINETTE 4X48 STRL (DRAPES) ×3 IMPLANT
SUT ETHILON 4 0 PS 2 18 (SUTURE) ×3 IMPLANT
SUT VICRYL 4-0 PS2 18IN ABS (SUTURE) IMPLANT
SYR BULB 3OZ (MISCELLANEOUS) ×3 IMPLANT
SYR CONTROL 10ML LL (SYRINGE) IMPLANT
TOWEL OR 17X24 6PK STRL BLUE (TOWEL DISPOSABLE) ×3 IMPLANT
UNDERPAD 30X30 (UNDERPADS AND DIAPERS) ×3 IMPLANT

## 2017-03-16 NOTE — H&P (Signed)
  Mario [name] is an 71 y.o. male.   Chief Complaint: numbness left hand HPI:  Mario [name] is a 71 year old right-hand-dominant male who has had 2 years of numbness and tingling in his left hand index middle ring fingers. He recalls no history of injury to the hand or to the neck. It awakens him 7 out of 7 nights. States driving seems to make this worse. He states there is some radiation up his arm. Ice and heat have helped along with an occasional Advil. He has no symptoms on his right side. Has a prior history of injury to his right thumb treated by Mario [name]. Is approximately 10 years ago. He has no history of diabetes thyroid problems arthritis or gout. Family history is negative for each of these also. States there is no particular activity that seems to make it better or worse. He is a former Clinical biochemist. He was sent to Dr. Thereasa [name] for nerve conductions which have been completed this reveals carpal tunnel syndrome on his left side along with a C7-8 radiculopathy chronic in nature. He shows a motor delay is 6.4 in the left median nerve with a sensory delay of 4.6.          Past Medical History:  Diagnosis Date  . Coronary artery disease   . Essential hypertension, benign   . Glaucoma   . Myocardial infarct ([place])   . Old myocardial infarction     Past Surgical History:  Procedure Laterality Date  . CORONARY STENT PLACEMENT    . rotator cuff surgery Bilateral     Family History  Problem Relation Age of Onset  . Lung cancer Mother   . CAD Father   . CVA Father   . Heart attack Father   . Hypertension Brother   . Heart attack Brother    Social History:  reports that  has never smoked. he has never used smokeless tobacco. He reports that he does not drink alcohol or use drugs.  Allergies:  Allergies  Allergen Reactions  . Zicam Cold Remedy [Erysidoron #1] Other (See Comments)    Unknown reaction  . Sulfa Antibiotics Rash    No medications prior to admission.    No  results found for this or any previous visit (from the past 48 hour(s)).  No results found.   Pertinent items are noted in HPI.  Height 5\' 9"  (1.753 m), weight 86.2 kg (190 lb).  General appearance: alert, cooperative and appears stated age Head: Normocephalic, without obvious abnormality Neck: no JVD Resp: clear to auscultation bilaterally Cardio: regular rate and rhythm, S1, S2 normal, no murmur, click, rub or gallop GI: soft, non-tender; bowel sounds normal; no masses,  no organomegaly Extremities: numb left hand Pulses: 2+ and symmetric Skin: Skin color, texture, turgor normal. No rashes or lesions Neurologic: Grossly normal Incision/Wound: na  Assessment/Plan Assessment:  1. Carpal tunnel syndrome of left wrist    Plan: Discussed his nerve conductions with him. We recommend he consider surgical decompression of the median nerve. Pre-peri-postoperative course were discussed along with risks and complications. He is aware there is no guarantee to the surgery the possibility of infection recurrence injury to arteries nerves tendons incomplete release symptoms dystrophy. He would like to proceed. This will be scheduled as an outpatient under regional anesthesia for release median nerve left wrist and outpatient.      Mario [name] R 03/16/2017, 5:57 AM

## 2017-03-16 NOTE — Transfer of Care (Signed)
Immediate Anesthesia Transfer of Care Note  Patient: Mario [name]  Procedure(s) Performed: LEFT CARPAL TUNNEL RELEASE (Left )  Patient Location: PACU  Anesthesia Type:Bier block  Level of Consciousness: awake, alert  and oriented  Airway & Oxygen Therapy: Patient Spontanous Breathing and Patient connected to face mask oxygen  Post-op Assessment: Report given to RN and Post -op Vital signs reviewed and stable  Post vital signs: Reviewed and stable  Last Vitals:  Vitals:   03/16/17 0750  BP: 134/78  Pulse: 66  Resp: 18  Temp: 36.8 C  SpO2: 100%    Last Pain:  Vitals:   03/16/17 0750  TempSrc: Oral         Complications: No apparent anesthesia complications

## 2017-03-16 NOTE — Discharge Instructions (Addendum)

## 2017-03-16 NOTE — Op Note (Signed)
Dictation Number [id number]

## 2017-03-16 NOTE — Anesthesia Preprocedure Evaluation (Signed)
Anesthesia Evaluation  Patient identified by MRN, date of birth, ID band Patient awake    Reviewed: Allergy & Precautions, NPO status , Patient's Chart, lab work & pertinent test results  Airway Mallampati: II  TM Distance: >3 FB Neck ROM: Full    Dental no notable dental hx.    Pulmonary neg pulmonary ROS,    Pulmonary exam normal breath sounds clear to auscultation       Cardiovascular hypertension, + CAD and + Past MI  Normal cardiovascular exam Rhythm:Regular Rate:Normal     Neuro/Psych negative neurological ROS  negative psych ROS   GI/Hepatic negative GI ROS, Neg liver ROS,   Endo/Other  negative endocrine ROS  Renal/GU negative Renal ROS  negative genitourinary   Musculoskeletal negative musculoskeletal ROS (+)   Abdominal   Peds negative pediatric ROS (+)  Hematology negative hematology ROS (+)   Anesthesia Other Findings   Reproductive/Obstetrics negative OB ROS                             Anesthesia Physical Anesthesia Plan  ASA: III  Anesthesia Plan: Bier Block and Bier Block-LIDOCAINE ONLY   Post-op Pain Management:    Induction: Intravenous  PONV Risk Score and Plan: 0  Airway Management Planned: Simple Face Mask  Additional Equipment:   Intra-op Plan:   Post-operative Plan:   Informed Consent: I have reviewed the patients History and Physical, chart, labs and discussed the procedure including the risks, benefits and alternatives for the proposed anesthesia with the patient or authorized representative who has indicated his/her understanding and acceptance.   Dental advisory given  Plan Discussed with: CRNA and Surgeon  Anesthesia Plan Comments:         Anesthesia Quick Evaluation

## 2017-03-16 NOTE — Brief Op Note (Signed)
03/16/2017  9:07 AM  PATIENT:  Mario [name]  71 y.o. male  PRE-OPERATIVE DIAGNOSIS:  LEFT CARPAL TUNNEL SYNDROME  POST-OPERATIVE DIAGNOSIS:  LEFT CARPAL TUNNEL SYNDROME  PROCEDURE:  Procedure(s): LEFT CARPAL TUNNEL RELEASE (Left)  SURGEON:  Surgeon(s) and Role:    * Daryll Brod, MD - Primary  PHYSICIAN ASSISTANT:   ASSISTANTS: none   ANESTHESIA:   local, regional and IV sedation  EBL: 35ml  BLOOD ADMINISTERED:none  DRAINS: none   LOCAL MEDICATIONS USED:  BUPIVICAINE   SPECIMEN:  No Specimen  DISPOSITION OF SPECIMEN:  N/A  COUNTS:  YES  TOURNIQUET:   Total Tourniquet Time Documented: Forearm (Left) - 22 minutes Total: Forearm (Left) - 22 minutes   DICTATION: .Other Dictation: Dictation Number [id number]  PLAN OF CARE: Discharge to home after PACU  PATIENT DISPOSITION:  PACU - hemodynamically stable.

## 2017-03-16 NOTE — Anesthesia Postprocedure Evaluation (Signed)
Anesthesia Post Note  Patient: Mario [name]  Procedure(s) Performed: LEFT CARPAL TUNNEL RELEASE (Left )     Patient location during evaluation: PACU Anesthesia Type: Bier Block Level of consciousness: awake and alert Pain management: pain level controlled Vital Signs Assessment: post-procedure vital signs reviewed and stable Respiratory status: spontaneous breathing, nonlabored ventilation, respiratory function stable and patient connected to nasal cannula oxygen Cardiovascular status: stable and blood pressure returned to baseline Postop Assessment: no apparent nausea or vomiting Anesthetic complications: no    Last Vitals:  Vitals:   03/16/17 0750 03/16/17 0906  BP: 134/78 115/64  Pulse: 66 70  Resp: 18 19  Temp: 36.8 C 36.4 C  SpO2: 100% 100%    Last Pain:  Vitals:   03/16/17 0906  TempSrc:   PainSc: 0-No pain                 [name] S

## 2017-03-17 ENCOUNTER — Encounter (HOSPITAL_BASED_OUTPATIENT_CLINIC_OR_DEPARTMENT_OTHER): Payer: Self-pay | Admitting: Orthopedic Surgery

## 2017-03-17 NOTE — Op Note (Signed)
NAME:  XADEN, KAUFMAN NO.:  [id number]  MEDICAL RECORD NO.:  [id number]  LOCATION:                                 FACILITY:  PHYSICIAN:  Daryll Brod, M.D.            DATE OF BIRTH:  DATE OF PROCEDURE:  03/16/2017 DATE OF DISCHARGE:                              OPERATIVE REPORT   PREOPERATIVE DIAGNOSIS:  Carpal tunnel syndrome, left hand.  POSTOPERATIVE DIAGNOSIS:  Carpal tunnel syndrome, left hand.  OPERATION:  Decompression, left median nerve.  SURGEON:  Daryll Brod, MD.  ASSISTANT:  None.  ANESTHESIA:  Forearm-based IV regional with IV sedation and local infiltration.  ANESTHESIOLOGIST:  Cleon Dew. Kalman Shan, MD.  PLACE OF SURGERY:  [facility].  HISTORY:  The patient is a 71 year old male with a history of numbness and tingling of his left arm.  Nerve conductions are positive for carpal tunnel syndrome.  He has elected to undergo surgical decompression to the median nerve on his left side.  Pre, peri, and postoperative courses have been discussed along with risks and complications.  He is aware that there is no guarantee to the surgery, the possibility of infection, recurrence of injury to arteries, nerves, and tendons, incomplete relief of symptoms, and dystrophy.  In the preoperative area, the patient is seen, the extremity marked by both patient and surgeon, antibiotic given.  DESCRIPTION OF PROCEDURE:  The patient was brought to the operating room where a forearm-based IV regional anesthetic was carried out without difficulty.  He was prepped using ChloraPrep in a supine position with the left arm free.  A 3-minute dry time was allowed and time-out was taken confirming the patient and procedure.  A longitudinal incision was made in the left palm and carried down through subcutaneous tissue. Bleeders were electrocauterized with bipolar.  The palmar fascia was split.  The superficial palmar arch was identified along with the flexor tendon  to the ring and little fingers.  Retractors were placed retracting median nerve of flexor tendons radial and the ulnar nerve ulnarly.  The flexor retinaculum was then released on its ulnar aspect with sharp dissection.  A right angle and Sewell retractor were placed between skin and forearm fascia.  The fascia was released for approximately 2 cm proximal to the wrist crease under direct vision. The canal was explored.  An area of compression to the nerve was apparent.  Motor branch entered into muscle distally.  The wound was copiously irrigated with saline.  The skin was then closed with interrupted 4-0 nylon sutures.  A local infiltration with 0.25% bupivacaine without epinephrine was given.  A total of 6.5 mL was used. A sterile compressive dressing with the fingers free was applied.  On deflation of the tourniquet, all fingers immediately pinked.  He was taken to the recovery room for observation in satisfactory condition.  He will be discharged to home to return to [place] in 1 week, on [place].          ______________________________ Daryll Brod, M.D.     GK/MEDQ  D:  03/16/2017  T:  03/17/2017  Job:  [id number]

## 2017-05-05 ENCOUNTER — Telehealth: Payer: Self-pay | Admitting: Interventional Cardiology

## 2017-05-05 DIAGNOSIS — E785 Hyperlipidemia, unspecified: Secondary | ICD-10-CM

## 2017-05-05 NOTE — Telephone Encounter (Signed)
New message    Pt c/o medication issue:  1. Name of Medication: atorvastatin (LIPITOR) 40 MG tablet  2. How are you currently taking this medication (dosage and times per day)? Take 1 tablet (40 mg total) by mouth daily at 6 PM.  3. Are you having a reaction (difficulty breathing--STAT)? No  4. What is your medication issue? Muscle weakness

## 2017-05-05 NOTE — Telephone Encounter (Signed)
Called an made patient aware of recommendation below. Patient verbalizes understanding. Patient is going to stop atorvastatin and call back in 2-4 weeks to report his symptoms.

## 2017-05-05 NOTE — Telephone Encounter (Signed)
Returned call to patient who states that he has been having muscle weakness and joint pain in his thighs and legs that he feels is related to his atorvastatin. Patient takes atorvastatin 40 mg daily. Patient's last LDL- 39 and non-HDL- 71 on 08/28/16.   It looks like patient was on IMPROVE-IT study in the past. It looks like the patient was changed to simvastatin 40 mg after the end of study, but his non-HDL remained ~ 110 mg/dL, and LDL was ~ 70 so he was changed to atorvastatin 40 mg qd with improvement.  Will forward to Dr. Irish Lack and [facility] for review and recommendation.

## 2017-05-05 NOTE — Telephone Encounter (Signed)
I agree with Kelly's recs.

## 2017-05-05 NOTE — Telephone Encounter (Signed)
I would recommend a 2-4 week wash out period to see if related to his atorvastatin. If improvement in muscle aches would recommend change to rosuvastatin 20mg  daily as this is less associated with the muscle aching than atorvastatin because of the way it is stored in the muscles and this will likely control his LDL. Will want to get lipid panel 8-12 weeks after start rosuvastatin.

## 2017-05-19 DIAGNOSIS — J018 Other acute sinusitis: Secondary | ICD-10-CM | POA: Diagnosis not present

## 2017-05-19 DIAGNOSIS — H6123 Impacted cerumen, bilateral: Secondary | ICD-10-CM | POA: Diagnosis not present

## 2017-05-27 ENCOUNTER — Telehealth: Payer: Self-pay | Admitting: Interventional Cardiology

## 2017-05-27 NOTE — Telephone Encounter (Signed)
New message    Patient calling to report (as suggested by Dr Irish Lack) Patient has been sick and taking antibiotics. He is not sure is he needs to continue not taking atorvastatin.   Pt c/o medication issue:  1. Name of Medication: atorvastatin  2. How are you currently taking this medication (dosage and times per day)? n/a  3. Are you having a reaction (difficulty breathing--STAT)? NO 4. What is your medication issue? Patient not taking medication, wants to know if he needs to stay off medication longer

## 2017-05-28 NOTE — Telephone Encounter (Signed)
Returned call to patient. Patient states that he has had an URI and has been taking abx and advil. Patient was taking a break from atorvastatin and allowing for a washout period to see if his muscle weakness and aches in his thighs would improve (see phone note from 05/05/17). Patient requesting an additional week off of the atorvastatin. Made patient aware that I would reach out to him in 1 week. Patient thanked me for the call.

## 2017-06-07 MED ORDER — ROSUVASTATIN CALCIUM 20 MG PO TABS: 20.0000 mg | ORAL_TABLET | Freq: Every day | ORAL | 3 refills | Status: DC

## 2017-06-07 NOTE — Telephone Encounter (Signed)
Called to follow up with patient regarding his break from atorvastatin. Patient states that the aching and weakness in his thighs is slightly improved. Made patient aware that we will start rosuvastatin 40 mg QD and recheck labs in 2-3 months. LIPIDS and LFTS ordered and lab appointment made for 09/09/17. Rx sent to patient's preferred pharmacy.

## 2017-08-08 ENCOUNTER — Other Ambulatory Visit: Payer: Self-pay | Admitting: Interventional Cardiology

## 2017-08-24 DIAGNOSIS — M47816 Spondylosis without myelopathy or radiculopathy, lumbar region: Secondary | ICD-10-CM | POA: Diagnosis not present

## 2017-09-01 ENCOUNTER — Other Ambulatory Visit: Payer: Self-pay | Admitting: Interventional Cardiology

## 2017-09-03 DIAGNOSIS — R05 Cough: Secondary | ICD-10-CM | POA: Diagnosis not present

## 2017-09-03 DIAGNOSIS — E7800 Pure hypercholesterolemia, unspecified: Secondary | ICD-10-CM | POA: Diagnosis not present

## 2017-09-03 DIAGNOSIS — M545 Low back pain: Secondary | ICD-10-CM | POA: Diagnosis not present

## 2017-09-03 DIAGNOSIS — I2510 Atherosclerotic heart disease of native coronary artery without angina pectoris: Secondary | ICD-10-CM | POA: Diagnosis not present

## 2017-09-03 DIAGNOSIS — Z1211 Encounter for screening for malignant neoplasm of colon: Secondary | ICD-10-CM | POA: Diagnosis not present

## 2017-09-03 DIAGNOSIS — Z1389 Encounter for screening for other disorder: Secondary | ICD-10-CM | POA: Diagnosis not present

## 2017-09-03 DIAGNOSIS — Z0000 Encounter for general adult medical examination without abnormal findings: Secondary | ICD-10-CM | POA: Diagnosis not present

## 2017-09-03 DIAGNOSIS — E291 Testicular hypofunction: Secondary | ICD-10-CM | POA: Diagnosis not present

## 2017-09-03 DIAGNOSIS — G8929 Other chronic pain: Secondary | ICD-10-CM | POA: Diagnosis not present

## 2017-09-03 DIAGNOSIS — I10 Essential (primary) hypertension: Secondary | ICD-10-CM | POA: Diagnosis not present

## 2017-09-07 ENCOUNTER — Encounter: Payer: Self-pay | Admitting: Interventional Cardiology

## 2017-09-07 ENCOUNTER — Encounter (INDEPENDENT_AMBULATORY_CARE_PROVIDER_SITE_OTHER): Payer: Self-pay

## 2017-09-07 ENCOUNTER — Other Ambulatory Visit: Payer: Medicare Other

## 2017-09-07 ENCOUNTER — Ambulatory Visit (INDEPENDENT_AMBULATORY_CARE_PROVIDER_SITE_OTHER): Payer: Medicare Other | Admitting: Interventional Cardiology

## 2017-09-07 VITALS — BP 124/78 | HR 78 | Ht 69.0 in | Wt 193.0 lb

## 2017-09-07 DIAGNOSIS — E785 Hyperlipidemia, unspecified: Secondary | ICD-10-CM

## 2017-09-07 DIAGNOSIS — I25119 Atherosclerotic heart disease of native coronary artery with unspecified angina pectoris: Secondary | ICD-10-CM | POA: Diagnosis not present

## 2017-09-07 DIAGNOSIS — I10 Essential (primary) hypertension: Secondary | ICD-10-CM | POA: Diagnosis not present

## 2017-09-07 DIAGNOSIS — E782 Mixed hyperlipidemia: Secondary | ICD-10-CM | POA: Diagnosis not present

## 2017-09-07 LAB — LIPID PANEL
CHOL/HDL RATIO: 3.5 ratio (ref 0.0–5.0)
Cholesterol, Total: 117 mg/dL (ref 100–199)
HDL: 33 mg/dL — ABNORMAL LOW (ref >39)
LDL CALC: 55 mg/dL (ref 0–99)
Triglycerides: 144 mg/dL (ref 0–149)
VLDL Cholesterol Cal: 29 mg/dL (ref 5–40)

## 2017-09-07 LAB — HEPATIC FUNCTION PANEL
ALBUMIN: 4.2 g/dL (ref 3.5–4.8)
ALK PHOS: 53 IU/L (ref 39–117)
ALT: 18 IU/L (ref 0–44)
AST: 22 IU/L (ref 0–40)
BILIRUBIN, DIRECT: 0.28 mg/dL (ref 0.00–0.40)
Bilirubin Total: 1.2 mg/dL (ref 0.0–1.2)
TOTAL PROTEIN: 6.6 g/dL (ref 6.0–8.5)

## 2017-09-07 NOTE — Progress Notes (Signed)
Cardiology Office Note   Date:  09/07/2017   ID:  Mario [name], DOB 03-26-1946, MRN [id number]  PCP:  Lavone Orn, MD    No chief complaint on file.  CAD  Wt Readings from Last 3 Encounters:  09/07/17 193 lb (87.5 kg)  03/16/17 191 lb (86.6 kg)  08/20/16 190 lb 3.2 oz (86.3 kg)       History of Present Illness: Mario [name] is a 72 y.o. male  who had an anterior MI, and BMS to the LAD in 2007-  Main sx was back pain and got pale.  He had a cardiac cath in 2012 showing a patent LAD stent. Negative stress test in 2014.   In the past, felt fatigue on metoprolol. He takes a nap in the evening and feels better.  At other MD visits, his BP has been well controlled.    He had some subjective muscle loss/weakness on atorvastatin.  We switched to Crestor and he felt better.   Denies : Chest pain. Dizziness. Leg edema. Nitroglycerin use. Orthopnea. Palpitations. Paroxysmal nocturnal dyspnea. Shortness of breath. Syncope.   He had some back pain since April.    He has been taking Advil for back pain.      Past Medical History:  Diagnosis Date  . Coronary artery disease   . Essential hypertension, benign   . Glaucoma   . Myocardial infarct ([place])   . Old myocardial infarction     Past Surgical History:  Procedure Laterality Date  . CARPAL TUNNEL RELEASE Left 03/16/2017   Procedure: LEFT CARPAL TUNNEL RELEASE;  Surgeon: Daryll Brod, MD;  Location: [facility];  Service: Orthopedics;  Laterality: Left;  . CORONARY STENT PLACEMENT    . rotator cuff surgery Bilateral      Current Outpatient Medications  Medication Sig Dispense Refill  . aspirin EC 81 MG tablet Take 1 tablet (81 mg total) by mouth daily. 90 tablet 3  . fluticasone (FLONASE) 50 MCG/ACT nasal spray USE 2 SPRAYS IN EACH NOSTRIL DAILY AT NIGHT  5  . latanoprost (XALATAN) 0.005 % ophthalmic solution Place 1 drop into both eyes at bedtime.    . metoprolol tartrate (LOPRESSOR) 25 MG tablet  Take 1 tablet (25 mg total) by mouth 2 (two) times daily. Patient needs an appointment for further refills 2nd attempt 30 tablet 0  . nitroGLYCERIN (NITROSTAT) 0.4 MG SL tablet Place 1 tablet (0.4 mg total) under the tongue every 5 (five) minutes as needed for chest pain (UP TO 3 DOSES BEFORE CALLING 911). 25 tablet 3  . rosuvastatin (CRESTOR) 20 MG tablet Take 1 tablet (20 mg total) by mouth daily. 90 tablet 3  . timolol (TIMOPTIC) 0.5 % ophthalmic solution Place 1 drop into both eyes 2 (two) times daily.  1  . valsartan (DIOVAN) 160 MG tablet TAKE 1 TABLET (160 MG TOTAL) BY MOUTH EVERY MORNING. 30 tablet 10   No current facility-administered medications for this visit.     Allergies:   Nsaids; Zicam cold remedy [erysidoron #1]; Sulfa antibiotics; and Sulfasalazine    Social History:  The patient  reports that he has never smoked. He has never used smokeless tobacco. He reports that he does not drink alcohol or use drugs.   Family History:  The patient's family history includes CAD in his father; CVA in his father; Heart attack in his brother and father; Hypertension in his brother; Lung cancer in his mother.    ROS:  Please  see the history of present illness.   Otherwise, review of systems are positive for cough.   All other systems are reviewed and negative.    PHYSICAL EXAM: VS:  BP 124/78   Pulse 78   Ht 5\' 9"  (1.753 m)   Wt 193 lb (87.5 kg)   SpO2 98%   BMI 28.50 kg/m  , BMI Body mass index is 28.5 kg/m. GEN: Well nourished, well developed, in no acute distress  HEENT: normal  Neck: no JVD, carotid bruits, or masses Cardiac: RRR; no murmurs, rubs, or gallops,no edema  Respiratory:  clear to auscultation bilaterally, normal work of breathing GI: soft, nontender, nondistended, + BS MS: no deformity or atrophy  Skin: warm and dry, no rash Neuro:  Strength and sensation are intact Psych: euthymic mood, full affect   EKG:   The ekg ordered today demonstrates NSR,  nonspecific ST changes inferolateral leads- no significant change from prior   Recent Labs: 09/07/2017: ALT 18   Lipid Panel    Component Value Date/Time   CHOL 117 09/07/2017 0756   TRIG 144 09/07/2017 0756   HDL 33 (L) 09/07/2017 0756   CHOLHDL 3.5 09/07/2017 0756   CHOLHDL 3 08/31/2014 0845   VLDL 25.2 08/31/2014 0845   LDLCALC 55 09/07/2017 0756     Other studies Reviewed: Additional studies/ records that were reviewed today with results demonstrating: lipids reviewed.   ASSESSMENT AND PLAN:  1. CAD: No angina on current medical therapy.  Continue aggressive secondary prevention. 2. Hyperlipidemia: LDL 55 on Crestor.  Continue current meds.  Tolerating Crestor better than atorvastatin. 3. HTN: Some higher readings at home.  Up to the 130s.  The current medical regimen is effective;  continue present plan and medications.   Current medicines are reviewed at length with the patient today.  The patient concerns regarding his medicines were addressed.  The following changes have been made:  No change  Labs/ tests ordered today include:  No orders of the defined types were placed in this encounter.   Recommend 150 minutes/week of aerobic exercise Low fat, low carb, high fiber diet recommended  Disposition:   FU in 1 year   Signed, Larae Grooms, MD  09/07/2017 4:48 PM    [facility] [place], [place], [place]  [address] Phone: [phone]; Fax: [phone]

## 2017-09-07 NOTE — Patient Instructions (Signed)

## 2017-09-09 ENCOUNTER — Other Ambulatory Visit: Payer: Medicare Other

## 2017-09-15 DIAGNOSIS — M47896 Other spondylosis, lumbar region: Secondary | ICD-10-CM | POA: Diagnosis not present

## 2017-09-15 DIAGNOSIS — M545 Low back pain: Secondary | ICD-10-CM | POA: Diagnosis not present

## 2017-09-21 DIAGNOSIS — M47896 Other spondylosis, lumbar region: Secondary | ICD-10-CM | POA: Diagnosis not present

## 2017-09-21 DIAGNOSIS — M545 Low back pain: Secondary | ICD-10-CM | POA: Diagnosis not present

## 2017-09-24 DIAGNOSIS — M47896 Other spondylosis, lumbar region: Secondary | ICD-10-CM | POA: Diagnosis not present

## 2017-09-24 DIAGNOSIS — M545 Low back pain: Secondary | ICD-10-CM | POA: Diagnosis not present

## 2017-09-28 DIAGNOSIS — M545 Low back pain: Secondary | ICD-10-CM | POA: Diagnosis not present

## 2017-09-28 DIAGNOSIS — M47896 Other spondylosis, lumbar region: Secondary | ICD-10-CM | POA: Diagnosis not present

## 2017-10-03 ENCOUNTER — Other Ambulatory Visit: Payer: Self-pay | Admitting: Interventional Cardiology

## 2017-10-04 DIAGNOSIS — L570 Actinic keratosis: Secondary | ICD-10-CM | POA: Diagnosis not present

## 2017-10-04 DIAGNOSIS — L814 Other melanin hyperpigmentation: Secondary | ICD-10-CM | POA: Diagnosis not present

## 2017-10-04 DIAGNOSIS — D2261 Melanocytic nevi of right upper limb, including shoulder: Secondary | ICD-10-CM | POA: Diagnosis not present

## 2017-10-04 DIAGNOSIS — D1801 Hemangioma of skin and subcutaneous tissue: Secondary | ICD-10-CM | POA: Diagnosis not present

## 2017-10-04 DIAGNOSIS — D2262 Melanocytic nevi of left upper limb, including shoulder: Secondary | ICD-10-CM | POA: Diagnosis not present

## 2017-10-04 DIAGNOSIS — L821 Other seborrheic keratosis: Secondary | ICD-10-CM | POA: Diagnosis not present

## 2017-10-04 DIAGNOSIS — D225 Melanocytic nevi of trunk: Secondary | ICD-10-CM | POA: Diagnosis not present

## 2017-10-06 DIAGNOSIS — M545 Low back pain: Secondary | ICD-10-CM | POA: Diagnosis not present

## 2017-10-06 DIAGNOSIS — M47896 Other spondylosis, lumbar region: Secondary | ICD-10-CM | POA: Diagnosis not present

## 2017-10-13 DIAGNOSIS — M545 Low back pain: Secondary | ICD-10-CM | POA: Diagnosis not present

## 2017-10-13 DIAGNOSIS — M47896 Other spondylosis, lumbar region: Secondary | ICD-10-CM | POA: Diagnosis not present

## 2017-10-20 DIAGNOSIS — M47896 Other spondylosis, lumbar region: Secondary | ICD-10-CM | POA: Diagnosis not present

## 2017-10-20 DIAGNOSIS — M545 Low back pain: Secondary | ICD-10-CM | POA: Diagnosis not present

## 2017-10-27 DIAGNOSIS — M47896 Other spondylosis, lumbar region: Secondary | ICD-10-CM | POA: Diagnosis not present

## 2017-10-27 DIAGNOSIS — M545 Low back pain: Secondary | ICD-10-CM | POA: Diagnosis not present

## 2017-12-03 ENCOUNTER — Other Ambulatory Visit: Payer: Self-pay | Admitting: Interventional Cardiology

## 2017-12-08 ENCOUNTER — Telehealth: Payer: Self-pay | Admitting: Interventional Cardiology

## 2017-12-08 NOTE — Telephone Encounter (Signed)
Called Mario [name] and inform Mario [name] that his medication has already been called in to his pharmacy and the pharmacy is getting his medication ready and if he has any other problems, questions or concerns, to call our office. Mario [name] verbalized understanding.

## 2017-12-08 NOTE — Telephone Encounter (Signed)
New message:        *STAT* If patient is at the pharmacy, call can be transferred to refill team.   1. Which medications need to be refilled? (please list name of each medication and dose if known) valsartan (DIOVAN) 160 MG tablet  2. Which pharmacy/location (including street and city if local pharmacy) is medication to be sent to?[id number] - [place], [place] [address]  3. Do they need a 30 day or 90 day supply? 30    Pt ask that we give him a call when the prescription has been sent to the pharmacy if we can.

## 2018-01-27 DIAGNOSIS — Z23 Encounter for immunization: Secondary | ICD-10-CM | POA: Diagnosis not present

## 2018-03-03 DIAGNOSIS — M1712 Unilateral primary osteoarthritis, left knee: Secondary | ICD-10-CM | POA: Diagnosis not present

## 2018-06-02 ENCOUNTER — Other Ambulatory Visit: Payer: Self-pay | Admitting: Interventional Cardiology

## 2018-09-13 DIAGNOSIS — Z1389 Encounter for screening for other disorder: Secondary | ICD-10-CM | POA: Diagnosis not present

## 2018-09-13 DIAGNOSIS — Z0000 Encounter for general adult medical examination without abnormal findings: Secondary | ICD-10-CM | POA: Diagnosis not present

## 2018-09-18 ENCOUNTER — Other Ambulatory Visit: Payer: Self-pay | Admitting: Interventional Cardiology

## 2018-10-10 DIAGNOSIS — L578 Other skin changes due to chronic exposure to nonionizing radiation: Secondary | ICD-10-CM | POA: Diagnosis not present

## 2018-10-10 DIAGNOSIS — L918 Other hypertrophic disorders of the skin: Secondary | ICD-10-CM | POA: Diagnosis not present

## 2018-10-10 DIAGNOSIS — L570 Actinic keratosis: Secondary | ICD-10-CM | POA: Diagnosis not present

## 2018-10-10 DIAGNOSIS — L821 Other seborrheic keratosis: Secondary | ICD-10-CM | POA: Diagnosis not present

## 2018-10-10 DIAGNOSIS — L820 Inflamed seborrheic keratosis: Secondary | ICD-10-CM | POA: Diagnosis not present

## 2018-10-10 DIAGNOSIS — D1801 Hemangioma of skin and subcutaneous tissue: Secondary | ICD-10-CM | POA: Diagnosis not present

## 2018-10-14 ENCOUNTER — Telehealth: Payer: Self-pay | Admitting: Interventional Cardiology

## 2018-10-14 NOTE — Progress Notes (Signed)
Cardiology Office Note   Date:  10/17/2018   ID:  Mavrik, Bynum [birth year], MRN [id number]  PCP:  Lavone Orn, MD    No chief complaint on file.  CAD  Wt Readings from Last 3 Encounters:  10/17/18 185 lb 6.4 oz (84.1 kg)  09/07/17 193 lb (87.5 kg)  03/16/17 191 lb (86.6 kg)       History of Present Illness: Mario [name] is a 73 y.o. male  who had an anterior MI, and BMS to the LAD in 2007- Main sx was back pain and got pale.  He had a cardiac cath in 2012 showing a patent LAD stent.Negative stress test in 2014.  In the past, felt fatigueon metoprolol. He takes a nap in the evening and feels better.  At other MD visits, his BP has been well controlled.  He had some subjective muscle loss/weakness on atorvastatin.  We switched to Crestor and he felt better.   Since the last visit, he has done well.    Denies : Chest pain. Dizziness. Leg edema. Nitroglycerin use. Orthopnea. Palpitations. Paroxysmal nocturnal dyspnea. Shortness of breath. Syncope.   Exercising less in the heat.    No COVID sx of late.        Past Medical History:  Diagnosis Date  . Coronary artery disease   . Essential hypertension, benign   . Glaucoma   . Myocardial infarct ([place])   . Old myocardial infarction     Past Surgical History:  Procedure Laterality Date  . CARPAL TUNNEL RELEASE Left 03/16/2017   Procedure: LEFT CARPAL TUNNEL RELEASE;  Surgeon: Daryll Brod, MD;  Location: [facility];  Service: Orthopedics;  Laterality: Left;  . CORONARY STENT PLACEMENT    . rotator cuff surgery Bilateral      Current Outpatient Medications  Medication Sig Dispense Refill  . aspirin EC 81 MG tablet Take 1 tablet (81 mg total) by mouth daily. 90 tablet 3  . fluticasone (FLONASE) 50 MCG/ACT nasal spray USE 2 SPRAYS IN EACH NOSTRIL DAILY AT NIGHT  5  . latanoprost (XALATAN) 0.005 % ophthalmic solution Place 1 drop into both eyes at bedtime.    . metoprolol tartrate  (LOPRESSOR) 25 MG tablet TAKE 1 TABLET BY MOUTH TWICE A DAY 180 tablet 1  . nitroGLYCERIN (NITROSTAT) 0.4 MG SL tablet Place 1 tablet (0.4 mg total) under the tongue every 5 (five) minutes as needed for chest pain (UP TO 3 DOSES BEFORE CALLING 911). 25 tablet 3  . rosuvastatin (CRESTOR) 20 MG tablet TAKE 1 TABLET BY MOUTH EVERY DAY 90 tablet 2  . timolol (TIMOPTIC) 0.5 % ophthalmic solution Place 1 drop into both eyes 2 (two) times daily.  1  . valsartan (DIOVAN) 160 MG tablet TAKE 1 TABLET (160 MG TOTAL) BY MOUTH EVERY MORNING. 90 tablet 2   No current facility-administered medications for this visit.     Allergies:   Nsaids, Zicam cold remedy [tucks], Sulfa antibiotics, and Sulfasalazine    Social History:  The patient  reports that he has never smoked. He has never used smokeless tobacco. He reports that he does not drink alcohol or use drugs.   Family History:  The patient's family history includes CAD in his father; CVA in his father; Heart attack in his brother and father; Hypertension in his brother; Lung cancer in his mother.    ROS:  Please see the history of present illness.   Otherwise, review of systems are positive  for bleeding easily with a cut.   All other systems are reviewed and negative.    PHYSICAL EXAM: VS:  BP 138/72   Pulse 64   Ht 5\' 9"  (1.753 m)   Wt 185 lb 6.4 oz (84.1 kg)   SpO2 99%   BMI 27.38 kg/m  , BMI Body mass index is 27.38 kg/m. GEN: Well nourished, well developed, in no acute distress  HEENT: normal  Neck: noD, carotid bruits, or masses Cardiac: RRR; no murmurs, rubs, or gallops,no edema ; 2+ PT pulses bilaterally Respiratory:  clear to auscultation bilaterally, normal work of breathing GI: soft, nontender, nondistended, + BS MS: no deformity or atrophy  Skin: warm and dry, no rash Neuro:  Strength and sensation are intact Psych: euthymic mood, full affect   EKG:   The ekg ordered today demonstrates NSR, inferior ST changes, similar to  prior.    Recent Labs: No results found for requested labs within last 8760 hours.   Lipid Panel    Component Value Date/Time   CHOL 117 09/07/2017 0756   TRIG 144 09/07/2017 0756   HDL 33 (L) 09/07/2017 0756   CHOLHDL 3.5 09/07/2017 0756   CHOLHDL 3 08/31/2014 0845   VLDL 25.2 08/31/2014 0845   LDLCALC 55 09/07/2017 0756     Other studies Reviewed: Additional studies/ records that were reviewed today with results demonstrating: labs reviewed.   ASSESSMENT AND PLAN:  1. CAD: No angina.  Continue aggressive prevention.  Increase exercise. Gym closing caused decrease in his exercise.  2. Hyperlipidemia: LDL 55 in 6/19.  Continue rosuvastatin. 3. HTN: OK to switch to Losartan 50 mg daily if valsartan shortage continues.  Needs labs after making this switch.  Still has 90 days of valsartan left.    Current medicines are reviewed at length with the patient today.  The patient concerns regarding his medicines were addressed.  The following changes have been made:  No change  Labs/ tests ordered today include: to be done with Dr. Laurann Montana. No orders of the defined types were placed in this encounter.   Recommend 150 minutes/week of aerobic exercise Low fat, low carb, high fiber diet recommended  Disposition:   FU in 1 year   Signed, Larae Grooms, MD  10/17/2018 9:19 AM    [facility] [place], [place], [place]  [address] Phone: [phone]; Fax: [phone]

## 2018-10-14 NOTE — Telephone Encounter (Signed)

## 2018-10-17 ENCOUNTER — Other Ambulatory Visit: Payer: Self-pay

## 2018-10-17 ENCOUNTER — Ambulatory Visit (INDEPENDENT_AMBULATORY_CARE_PROVIDER_SITE_OTHER): Payer: Medicare Other | Admitting: Interventional Cardiology

## 2018-10-17 ENCOUNTER — Encounter: Payer: Self-pay | Admitting: Interventional Cardiology

## 2018-10-17 VITALS — BP 138/72 | HR 64 | Ht 69.0 in | Wt 185.4 lb

## 2018-10-17 DIAGNOSIS — I25119 Atherosclerotic heart disease of native coronary artery with unspecified angina pectoris: Secondary | ICD-10-CM

## 2018-10-17 DIAGNOSIS — I10 Essential (primary) hypertension: Secondary | ICD-10-CM

## 2018-10-17 DIAGNOSIS — E782 Mixed hyperlipidemia: Secondary | ICD-10-CM

## 2018-10-17 NOTE — Patient Instructions (Signed)
Medication Instructions:   Your physician has recommended you make the following change in your medication:  Finish your Valsartan prescription, if you are unable to get this refilled at your pharmacy please call and let us know and we will transition you to another medication.  If you need a refill on your cardiac medications before your next appointment, please call your pharmacy.   Lab work:  None ordered today  If you have labs (blood work) drawn today and your tests are completely normal, you will receive your results only by: Marland Kitchen MyChart Message (if you have MyChart) OR . A paper copy in the mail If you have any lab test that is abnormal or we need to change your treatment, we will call you to review the results.  Testing/Procedures:  None ordered today  Follow-Up: At [facility], you and your health needs are our priority.  As part of our continuing mission to provide you with exceptional heart care, we have created designated Provider Care Teams.  These Care Teams include your primary Cardiologist (physician) and Advanced Practice Providers (APPs -  Physician Assistants and Nurse Practitioners) who all work together to provide you with the care you need, when you need it. You will need a follow up appointment in 12 months.  Please call our office 2 months in advance to schedule this appointment.  You may see Larae Grooms, MD or one of the following Advanced Practice Providers on your designated Care Team:   [place], PA-C Melina Copa, PA-C . Ermalinda Barrios, PA-C

## 2018-12-13 DIAGNOSIS — Z23 Encounter for immunization: Secondary | ICD-10-CM | POA: Diagnosis not present

## 2019-01-24 ENCOUNTER — Encounter (INDEPENDENT_AMBULATORY_CARE_PROVIDER_SITE_OTHER): Payer: Self-pay

## 2019-03-05 ENCOUNTER — Other Ambulatory Visit: Payer: Self-pay | Admitting: Interventional Cardiology

## 2019-03-06 ENCOUNTER — Other Ambulatory Visit: Payer: Self-pay | Admitting: Interventional Cardiology

## 2019-03-06 MED ORDER — VALSARTAN 160 MG PO TABS: 160.0000 mg | ORAL_TABLET | Freq: Every morning | ORAL | 2 refills | Status: DC

## 2019-03-06 MED ORDER — METOPROLOL TARTRATE 25 MG PO TABS: 25.0000 mg | ORAL_TABLET | Freq: Two times a day (BID) | ORAL | 2 refills | Status: DC

## 2019-03-06 MED ORDER — ROSUVASTATIN CALCIUM 20 MG PO TABS: 20.0000 mg | ORAL_TABLET | Freq: Every day | ORAL | 2 refills | Status: DC

## 2019-03-06 NOTE — Telephone Encounter (Signed)
Pt's medication was sent to pt's pharmacy as requested. Confirmation received.  °

## 2019-04-25 DIAGNOSIS — M25512 Pain in left shoulder: Secondary | ICD-10-CM | POA: Diagnosis not present

## 2019-07-12 ENCOUNTER — Encounter (INDEPENDENT_AMBULATORY_CARE_PROVIDER_SITE_OTHER): Payer: Self-pay

## 2019-07-12 NOTE — Progress Notes (Unsigned)
Faxed Rx refill request to [facility]. Fluticasone prop 50mg  spray. Use 2 sprays each nostrilQD. W/ 6 refills

## 2019-08-21 DIAGNOSIS — R5383 Other fatigue: Secondary | ICD-10-CM | POA: Diagnosis not present

## 2019-08-21 DIAGNOSIS — M545 Low back pain: Secondary | ICD-10-CM | POA: Diagnosis not present

## 2019-08-21 DIAGNOSIS — I10 Essential (primary) hypertension: Secondary | ICD-10-CM | POA: Diagnosis not present

## 2019-08-21 DIAGNOSIS — E291 Testicular hypofunction: Secondary | ICD-10-CM | POA: Diagnosis not present

## 2019-08-21 DIAGNOSIS — R400 Somnolence: Secondary | ICD-10-CM | POA: Diagnosis not present

## 2019-08-21 DIAGNOSIS — L989 Disorder of the skin and subcutaneous tissue, unspecified: Secondary | ICD-10-CM | POA: Diagnosis not present

## 2019-08-21 DIAGNOSIS — E7800 Pure hypercholesterolemia, unspecified: Secondary | ICD-10-CM | POA: Diagnosis not present

## 2019-08-21 DIAGNOSIS — G8929 Other chronic pain: Secondary | ICD-10-CM | POA: Diagnosis not present

## 2019-08-25 DIAGNOSIS — E291 Testicular hypofunction: Secondary | ICD-10-CM | POA: Diagnosis not present

## 2019-08-30 DIAGNOSIS — E291 Testicular hypofunction: Secondary | ICD-10-CM | POA: Diagnosis not present

## 2019-09-05 DIAGNOSIS — E291 Testicular hypofunction: Secondary | ICD-10-CM | POA: Diagnosis not present

## 2019-10-13 DIAGNOSIS — N503 Cyst of epididymis: Secondary | ICD-10-CM | POA: Diagnosis not present

## 2019-10-13 DIAGNOSIS — I861 Scrotal varices: Secondary | ICD-10-CM | POA: Diagnosis not present

## 2019-10-13 DIAGNOSIS — N5089 Other specified disorders of the male genital organs: Secondary | ICD-10-CM | POA: Diagnosis not present

## 2019-10-13 DIAGNOSIS — N453 Epididymo-orchitis: Secondary | ICD-10-CM | POA: Diagnosis not present

## 2019-10-13 DIAGNOSIS — N433 Hydrocele, unspecified: Secondary | ICD-10-CM | POA: Diagnosis not present

## 2019-10-22 NOTE — Progress Notes (Signed)
Cardiology Office Note   Date:  10/24/2019   ID:  Mario, [name] 1945/06/28, MRN [id number]  PCP:  Lavone Orn, MD    No chief complaint on file.  CAD  Wt Readings from Last 3 Encounters:  10/24/19 191 lb 3.2 oz (86.7 kg)  10/17/18 185 lb 6.4 oz (84.1 kg)  09/07/17 193 lb (87.5 kg)       History of Present Illness: Mario [name] is a 74 y.o. male  who had an anterior MI, and BMS to the LAD in 2007- Main sx was back pain and got pale.  He had a cardiac cath in 2012 showing a patent LAD stent.Negative stress test in 2014.  In the past, felt fatigueon metoprolol. He takes a nap in the evening and feels better.  He had some subjective muscle loss/weakness on atorvastatin. We switched to Crestor and he felt better.   Since the last visit, he had a left testicle infection- treated with antibiotics.  He will be seeing a urologist.   Denies : Chest pain. Dizziness. Leg edema. Nitroglycerin use. Orthopnea. Palpitations. Paroxysmal nocturnal dyspnea. Shortness of breath. Syncope.   BP at home has been controlled- 578 systolics for the most part.  Uses an OMRON cuff.    Received his COVID vaccines.   Past Medical History:  Diagnosis Date  . Coronary artery disease   . Essential hypertension, benign   . Glaucoma   . Myocardial infarct ([place])   . Old myocardial infarction     Past Surgical History:  Procedure Laterality Date  . CARPAL TUNNEL RELEASE Left 03/16/2017   Procedure: LEFT CARPAL TUNNEL RELEASE;  Surgeon: Daryll Brod, MD;  Location: [facility];  Service: Orthopedics;  Laterality: Left;  . CORONARY STENT PLACEMENT    . rotator cuff surgery Bilateral      Current Outpatient Medications  Medication Sig Dispense Refill  . aspirin EC 81 MG tablet Take 1 tablet (81 mg total) by mouth daily. 90 tablet 3  . fluticasone (FLONASE) 50 MCG/ACT nasal spray Place 1 spray into both nostrils as needed.   5  . latanoprost (XALATAN) 0.005 %  ophthalmic solution Place 1 drop into both eyes at bedtime.    . metoprolol tartrate (LOPRESSOR) 25 MG tablet Take 1 tablet (25 mg total) by mouth 2 (two) times daily. 180 tablet 2  . nitroGLYCERIN (NITROSTAT) 0.4 MG SL tablet Place 1 tablet (0.4 mg total) under the tongue every 5 (five) minutes as needed for chest pain (UP TO 3 DOSES BEFORE CALLING 911). 25 tablet 3  . rosuvastatin (CRESTOR) 20 MG tablet Take 1 tablet (20 mg total) by mouth daily. 90 tablet 2  . timolol (TIMOPTIC) 0.5 % ophthalmic solution Place 1 drop into both eyes 2 (two) times daily.  1  . valsartan (DIOVAN) 160 MG tablet Take 1 tablet (160 mg total) by mouth every morning. 90 tablet 2   No current facility-administered medications for this visit.    Allergies:   Nsaids, Zicam cold remedy [homeopathic products], Sulfa antibiotics, and Sulfasalazine    Social History:  The patient  reports that he has never smoked. He has never used smokeless tobacco. He reports that he does not drink alcohol and does not use drugs.   Family History:  The patient's family history includes CAD in his father; CVA in his father; Heart attack in his brother and father; Hypertension in his brother; Lung cancer in his mother.    ROS:  Please see the history of present illness.   Otherwise, review of systems are positive for testicular infection.   All other systems are reviewed and negative.    PHYSICAL EXAM: VS:  BP 126/70   Pulse 64   Ht 5\' 10"  (1.778 m)   Wt 191 lb 3.2 oz (86.7 kg)   SpO2 99%   BMI 27.43 kg/m  , BMI Body mass index is 27.43 kg/m. GEN: Well nourished, well developed, in no acute distress  HEENT: normal  Neck: no JVD, carotid bruits, or masses Cardiac: RRR; no murmurs, rubs, or gallops,no edema  Respiratory:  clear to auscultation bilaterally, normal work of breathing GI: soft, nontender, nondistended, + BS MS: no deformity or atrophy  Skin: warm and dry, no rash Neuro:  Strength and sensation are intact Psych:  euthymic mood, full affect   EKG:   The ekg ordered today demonstrates NSR. LAD, no ST changes   Recent Labs: No results found for requested labs within last 8760 hours.   Lipid Panel    Component Value Date/Time   CHOL 117 09/07/2017 0756   TRIG 144 09/07/2017 0756   HDL 33 (L) 09/07/2017 0756   CHOLHDL 3.5 09/07/2017 0756   CHOLHDL 3 08/31/2014 0845   VLDL 25.2 08/31/2014 0845   LDLCALC 55 09/07/2017 0756     Other studies Reviewed: Additional studies/ records that were reviewed today with results demonstrating: LDL 39 May 2021.   ASSESSMENT AND PLAN:  1. CAD: Continue aggressive secondary prevention.  No angina on medical therapy.  Continue regular exercise. 2. Hyperlipidemia: Rosuvastatin better tolerated than atorvastatin.  LDL controlled.  3. HTN: Low-salt diet.  Whole food, plant-based diet recommended. 4. He is considering sleep study as well.  No falling asleep while driving.  Wife can monitor for apnea.   Current medicines are reviewed at length with the patient today.  The patient concerns regarding his medicines were addressed.  The following changes have been made:  No change  Labs/ tests ordered today include:  No orders of the defined types were placed in this encounter.   Recommend 150 minutes/week of aerobic exercise Low fat, low carb, high fiber diet recommended  Disposition:   FU in 1 year   Signed, Larae Grooms, MD  10/24/2019 10:05 AM    [facility] [place], [place], [place]  [address] Phone: [phone]; Fax: [phone]

## 2019-10-24 ENCOUNTER — Other Ambulatory Visit: Payer: Self-pay

## 2019-10-24 ENCOUNTER — Ambulatory Visit (INDEPENDENT_AMBULATORY_CARE_PROVIDER_SITE_OTHER): Payer: Medicare Other | Admitting: Interventional Cardiology

## 2019-10-24 ENCOUNTER — Encounter: Payer: Self-pay | Admitting: Interventional Cardiology

## 2019-10-24 VITALS — BP 126/70 | HR 64 | Ht 70.0 in | Wt 191.2 lb

## 2019-10-24 DIAGNOSIS — I25119 Atherosclerotic heart disease of native coronary artery with unspecified angina pectoris: Secondary | ICD-10-CM

## 2019-10-24 DIAGNOSIS — I10 Essential (primary) hypertension: Secondary | ICD-10-CM | POA: Diagnosis not present

## 2019-10-24 DIAGNOSIS — E782 Mixed hyperlipidemia: Secondary | ICD-10-CM | POA: Diagnosis not present

## 2019-10-24 MED ORDER — NITROGLYCERIN 0.4 MG SL SUBL: 0.4000 mg | SUBLINGUAL_TABLET | SUBLINGUAL | 3 refills | Status: DC | PRN

## 2019-10-24 NOTE — Patient Instructions (Signed)
Medication Instructions:  Your physician recommends that you continue on your current medications as directed. Please refer to the Current Medication list given to you today.  *If you need a refill on your cardiac medications before your next appointment, please call your pharmacy*   Lab Work: None If you have labs (blood work) drawn today and your tests are completely normal, you will receive your results only by: Marland Kitchen MyChart Message (if you have MyChart) OR . A paper copy in the mail If you have any lab test that is abnormal or we need to change your treatment, we will call you to review the results.   Testing/Procedures: None   Follow-Up: At [facility], you and your health needs are our priority.  As part of our continuing mission to provide you with exceptional heart care, we have created designated Provider Care Teams.  These Care Teams include your primary Cardiologist (physician) and Advanced Practice Providers (APPs -  Physician Assistants and Nurse Practitioners) who all work together to provide you with the care you need, when you need it.   Your next appointment:   12 month(s)  The format for your next appointment:   In Person  Provider:   You may see Dr Irish Lack or one of the following Advanced Practice Providers on your designated Care Team:    Melina Copa, PA-C  Ermalinda Barrios, PA-C    Other Instructions  High-Fiber Diet Fiber, also called dietary fiber, is a type of carbohydrate that is found in fruits, vegetables, whole grains, and beans. A high-fiber diet can have many health benefits. Your health care provider may recommend a high-fiber diet to help:  Prevent constipation. Fiber can make your bowel movements more regular.  Lower your cholesterol.  Relieve the following conditions: ? Swelling of veins in the anus (hemorrhoids). ? Swelling and irritation (inflammation) of specific areas of the digestive tract (uncomplicated diverticulosis). ? A problem  of the large intestine (colon) that sometimes causes pain and diarrhea (irritable bowel syndrome, IBS).  Prevent overeating as part of a weight-loss plan.  Prevent heart disease, type 2 diabetes, and certain cancers. What is my plan? The recommended daily fiber intake in grams (g) includes:  38 g for men age 85 or younger.  30 g for men over age 44.  59 g for women age 70 or younger.  21 g for women over age 53. You can get the recommended daily intake of dietary fiber by:  Eating a variety of fruits, vegetables, grains, and beans.  Taking a fiber supplement, if it is not possible to get enough fiber through your diet. What do I need to know about a high-fiber diet?  It is better to get fiber through food sources rather than from fiber supplements. There is not a lot of research about how effective supplements are.  Always check the fiber content on the nutrition facts label of any prepackaged food. Look for foods that contain 5 g of fiber or more per serving.  Talk with a diet and nutrition specialist (dietitian) if you have questions about specific foods that are recommended or not recommended for your medical condition, especially if those foods are not listed below.  Gradually increase how much fiber you consume. If you increase your intake of dietary fiber too quickly, you may have bloating, cramping, or gas.  Drink plenty of water. Water helps you to digest fiber. What are tips for following this plan?  Eat a wide variety of high-fiber foods.  Make sure that half of the grains that you eat each day are whole grains.  Eat breads and cereals that are made with whole-grain flour instead of refined flour or white flour.  Eat Lafavor rice, bulgur wheat, or millet instead of white rice.  Start the day with a breakfast that is high in fiber, such as a cereal that contains 5 g of fiber or more per serving.  Use beans in place of meat in soups, salads, and pasta dishes.  Eat  high-fiber snacks, such as berries, raw vegetables, nuts, and popcorn.  Choose whole fruits and vegetables instead of processed forms like juice or sauce. What foods can I eat?  Fruits Berries. Pears. Apples. Oranges. Avocado. Prunes and raisins. Dried figs. Vegetables Sweet potatoes. Spinach. Kale. Artichokes. Cabbage. Broccoli. Cauliflower. Green peas. Carrots. Squash. Grains Whole-grain breads. Multigrain cereal. Oats and oatmeal. [place]yer rice. Barley. Bulgur wheat. [place]. Quinoa. Bran muffins. Popcorn. Rye wafer crackers. Meats and other proteins Navy, kidney, and pinto beans. Soybeans. Split peas. Lentils. Nuts and seeds. Dairy Fiber-fortified yogurt. Beverages Fiber-fortified soy milk. Fiber-fortified orange juice. Other foods Fiber bars. The items listed above may not be a complete list of recommended foods and beverages. Contact a dietitian for more options. What foods are not recommended? Fruits Fruit juice. Cooked, strained fruit. Vegetables Fried potatoes. Canned vegetables. Well-cooked vegetables. Grains White bread. Pasta made with refined flour. White rice. Meats and other proteins Fatty cuts of meat. Fried chicken or fried fish. Dairy Milk. Yogurt. Cream cheese. Sour cream. Fats and oils Butters. Beverages Soft drinks. Other foods Cakes and pastries. The items listed above may not be a complete list of foods and beverages to avoid. Contact a dietitian for more information. Summary  Fiber is a type of carbohydrate. It is found in fruits, vegetables, whole grains, and beans.  There are many health benefits of eating a high-fiber diet, such as preventing constipation, lowering blood cholesterol, helping with weight loss, and reducing your risk of heart disease, diabetes, and certain cancers.  Gradually increase your intake of fiber. Increasing too fast can result in cramping, bloating, and gas. Drink plenty of water while you increase your fiber.  The best  sources of fiber include whole fruits and vegetables, whole grains, nuts, seeds, and beans. This information is not intended to replace advice given to you by your health care provider. Make sure you discuss any questions you have with your health care provider. Document Revised: 01/18/2017 Document Reviewed: 01/18/2017 Elsevier Patient Education  2020 [employer].

## 2019-11-03 DIAGNOSIS — E291 Testicular hypofunction: Secondary | ICD-10-CM | POA: Diagnosis not present

## 2019-11-03 DIAGNOSIS — N5089 Other specified disorders of the male genital organs: Secondary | ICD-10-CM | POA: Diagnosis not present

## 2019-11-03 DIAGNOSIS — G8929 Other chronic pain: Secondary | ICD-10-CM | POA: Diagnosis not present

## 2019-11-03 DIAGNOSIS — I10 Essential (primary) hypertension: Secondary | ICD-10-CM | POA: Diagnosis not present

## 2019-11-03 DIAGNOSIS — I2510 Atherosclerotic heart disease of native coronary artery without angina pectoris: Secondary | ICD-10-CM | POA: Diagnosis not present

## 2019-11-03 DIAGNOSIS — Z1389 Encounter for screening for other disorder: Secondary | ICD-10-CM | POA: Diagnosis not present

## 2019-11-03 DIAGNOSIS — Z0000 Encounter for general adult medical examination without abnormal findings: Secondary | ICD-10-CM | POA: Diagnosis not present

## 2019-11-03 DIAGNOSIS — R400 Somnolence: Secondary | ICD-10-CM | POA: Diagnosis not present

## 2019-11-03 DIAGNOSIS — M545 Low back pain: Secondary | ICD-10-CM | POA: Diagnosis not present

## 2019-11-21 ENCOUNTER — Other Ambulatory Visit: Payer: Self-pay | Admitting: Interventional Cardiology

## 2019-11-27 DIAGNOSIS — N50812 Left testicular pain: Secondary | ICD-10-CM | POA: Diagnosis not present

## 2019-11-27 DIAGNOSIS — N432 Other hydrocele: Secondary | ICD-10-CM | POA: Diagnosis not present

## 2019-11-30 ENCOUNTER — Other Ambulatory Visit: Payer: Self-pay | Admitting: Urology

## 2019-11-30 DIAGNOSIS — N433 Hydrocele, unspecified: Secondary | ICD-10-CM

## 2019-12-08 ENCOUNTER — Other Ambulatory Visit: Payer: Medicare Other

## 2019-12-12 ENCOUNTER — Other Ambulatory Visit: Payer: Medicare Other

## 2019-12-14 ENCOUNTER — Ambulatory Visit
Admission: RE | Admit: 2019-12-14 | Discharge: 2019-12-14 | Disposition: A | Discharge status: Home / Self Care | Payer: Medicare Other | Source: Ambulatory Visit | Attending: Urology | Admitting: Urology

## 2019-12-14 DIAGNOSIS — N433 Hydrocele, unspecified: Secondary | ICD-10-CM

## 2019-12-14 DIAGNOSIS — N4342 Spermatocele of epididymis, multiple: Secondary | ICD-10-CM | POA: Diagnosis not present

## 2019-12-14 DIAGNOSIS — I861 Scrotal varices: Secondary | ICD-10-CM | POA: Diagnosis not present

## 2019-12-14 DIAGNOSIS — N503 Cyst of epididymis: Secondary | ICD-10-CM | POA: Diagnosis not present

## 2020-01-20 ENCOUNTER — Other Ambulatory Visit (INDEPENDENT_AMBULATORY_CARE_PROVIDER_SITE_OTHER): Payer: Self-pay | Admitting: Otolaryngology

## 2020-02-02 DIAGNOSIS — R3912 Poor urinary stream: Secondary | ICD-10-CM | POA: Diagnosis not present

## 2020-02-02 DIAGNOSIS — N4342 Spermatocele of epididymis, multiple: Secondary | ICD-10-CM | POA: Diagnosis not present

## 2020-02-02 DIAGNOSIS — N528 Other male erectile dysfunction: Secondary | ICD-10-CM | POA: Diagnosis not present

## 2020-02-02 DIAGNOSIS — N401 Enlarged prostate with lower urinary tract symptoms: Secondary | ICD-10-CM | POA: Diagnosis not present

## 2020-02-04 DIAGNOSIS — Z23 Encounter for immunization: Secondary | ICD-10-CM | POA: Diagnosis not present

## 2020-03-15 DIAGNOSIS — R3912 Poor urinary stream: Secondary | ICD-10-CM | POA: Diagnosis not present

## 2020-03-15 DIAGNOSIS — N401 Enlarged prostate with lower urinary tract symptoms: Secondary | ICD-10-CM | POA: Diagnosis not present

## 2020-03-15 DIAGNOSIS — N528 Other male erectile dysfunction: Secondary | ICD-10-CM | POA: Diagnosis not present

## 2020-11-06 ENCOUNTER — Other Ambulatory Visit (INDEPENDENT_AMBULATORY_CARE_PROVIDER_SITE_OTHER): Payer: Self-pay | Admitting: Otolaryngology

## 2020-11-06 ENCOUNTER — Other Ambulatory Visit: Payer: Self-pay | Admitting: Interventional Cardiology

## 2020-11-24 ENCOUNTER — Emergency Department (HOSPITAL_BASED_OUTPATIENT_CLINIC_OR_DEPARTMENT_OTHER)
Admission: EM | Admit: 2020-11-24 | Discharge: 2020-11-24 | Disposition: A | Discharge status: Home / Self Care | Payer: Medicare Other | Attending: Emergency Medicine | Admitting: Emergency Medicine

## 2020-11-24 ENCOUNTER — Other Ambulatory Visit: Payer: Self-pay

## 2020-11-24 ENCOUNTER — Emergency Department (HOSPITAL_BASED_OUTPATIENT_CLINIC_OR_DEPARTMENT_OTHER): Payer: Medicare Other

## 2020-11-24 DIAGNOSIS — S0081XA Abrasion of other part of head, initial encounter: Secondary | ICD-10-CM | POA: Diagnosis not present

## 2020-11-24 DIAGNOSIS — Z7982 Long term (current) use of aspirin: Secondary | ICD-10-CM | POA: Insufficient documentation

## 2020-11-24 DIAGNOSIS — I10 Essential (primary) hypertension: Secondary | ICD-10-CM | POA: Insufficient documentation

## 2020-11-24 DIAGNOSIS — W19XXXA Unspecified fall, initial encounter: Secondary | ICD-10-CM

## 2020-11-24 DIAGNOSIS — Z79899 Other long term (current) drug therapy: Secondary | ICD-10-CM | POA: Diagnosis not present

## 2020-11-24 DIAGNOSIS — S0011XA Contusion of right eyelid and periocular area, initial encounter: Secondary | ICD-10-CM | POA: Insufficient documentation

## 2020-11-24 DIAGNOSIS — S0990XA Unspecified injury of head, initial encounter: Secondary | ICD-10-CM | POA: Diagnosis present

## 2020-11-24 DIAGNOSIS — W01198A Fall on same level from slipping, tripping and stumbling with subsequent striking against other object, initial encounter: Secondary | ICD-10-CM | POA: Insufficient documentation

## 2020-11-24 DIAGNOSIS — I2510 Atherosclerotic heart disease of native coronary artery without angina pectoris: Secondary | ICD-10-CM | POA: Diagnosis not present

## 2020-11-24 NOTE — ED Triage Notes (Signed)
Patient reports to the ER for a fall yesterday by missing a step and reports he hit his head and bruised his face. Patient reports right sided eye swelling. Patient reports he wears bifocals and is unsure if his vision is changed from baseline

## 2020-11-24 NOTE — ED Provider Notes (Signed)
[facility] EMERGENCY DEPT Provider Note   CSN: [id number] Arrival date & time: 11/24/20  1145     History Chief Complaint  Patient presents with   Mario [name] is a 75 y.o. male.   Fall Pertinent negatives include no chest pain, no abdominal pain, no headaches and no shortness of breath. Patient presents after a fall yesterday.  He was maneuvering on a wooden deck.  He lost his footing and had a fall.  He fell and struck the right side of his face.  At the time, he was wearing his bifocal glasses.  He had some small abrasions to the lateral area of his face which she washed with soap and water.  He applied antibiotic ointment and cover them with bandages.  He reports only mild associated swelling which has diminished since yesterday.  He did develop bruising around his right eye overnight.  He denies any significant discomfort at this time.  He has not had any severe headaches, dizziness, areas of numbness, weakness.  He denies any nausea.  Described as mechanical after missing a step.  Struck his face and head.  Has had right eye swelling.  Patient denies any blood thinner use.  He presents to the ED at the request of his wife who was worried about his bruised upper eyelid.     Past Medical History:  Diagnosis Date   Coronary artery disease    Essential hypertension, benign    Glaucoma    Myocardial infarct [facility])    Old myocardial infarction     Patient Active Problem List   Diagnosis Date Noted   Old myocardial infarction    Essential hypertension, benign    Hyperlipidemia 07/10/2008   Essential hypertension 07/10/2008   Coronary atherosclerosis 123456   SYSTOLIC MURMUR 123456    Past Surgical History:  Procedure Laterality Date   CARPAL TUNNEL RELEASE Left 03/16/2017   Procedure: LEFT CARPAL TUNNEL RELEASE;  Surgeon: Daryll Brod, MD;  Location: [facility];  Service: Orthopedics;  Laterality: Left;   CORONARY STENT  PLACEMENT     rotator cuff surgery Bilateral        Family History  Problem Relation Age of Onset   Lung cancer Mother    CAD Father    CVA Father    Heart attack Father    Hypertension Brother    Heart attack Brother     Social History   Tobacco Use   Smoking status: Never   Smokeless tobacco: Never  Vaping Use   Vaping Use: Never used  Substance Use Topics   Alcohol use: No   Drug use: No    Home Medications Prior to Admission medications   Medication Sig Start Date End Date Taking? Authorizing Provider  aspirin EC 81 MG tablet Take 1 tablet (81 mg total) by mouth daily. 08/20/16  Yes Jettie Booze, MD  latanoprost (XALATAN) 0.005 % ophthalmic solution Place 1 drop into both eyes at bedtime.   Yes [provider]  metoprolol tartrate (LOPRESSOR) 25 MG tablet TAKE 1 TABLET BY MOUTH TWICE A DAY 11/06/20  Yes Jettie Booze, MD  rosuvastatin (CRESTOR) 20 MG tablet TAKE 1 TABLET BY MOUTH EVERY DAY 11/06/20  Yes Jettie Booze, MD  timolol (TIMOPTIC) 0.5 % ophthalmic solution Place 1 drop into both eyes 2 (two) times daily. 06/18/16  Yes [provider]  valsartan (DIOVAN) 160 MG tablet TAKE 1 TABLET BY MOUTH EVERY DAY IN THE  MORNING 11/06/20  Yes Jettie Booze, MD  fluticasone [facility]) 50 MCG/ACT nasal spray USE 2 SPRAYS AS DIRECTED EVERY DAY 01/23/20   Rozetta Nunnery, MD  nitroGLYCERIN (NITROSTAT) 0.4 MG SL tablet Place 1 tablet (0.4 mg total) under the tongue every 5 (five) minutes as needed for chest pain (UP TO 3 DOSES BEFORE CALLING 911). 10/24/19   Jettie Booze, MD    Allergies    Nsaids, Zicam cold remedy [homeopathic products], Sulfa antibiotics, and Sulfasalazine  Review of Systems   Review of Systems  Constitutional:  Negative for appetite change, chills, fatigue and fever.  HENT:  Negative for ear pain, nosebleeds and sore throat.   Eyes:  Negative for photophobia, pain and visual disturbance.   Respiratory:  Negative for cough, chest tightness, shortness of breath and wheezing.   Cardiovascular:  Negative for chest pain and palpitations.  Gastrointestinal:  Negative for abdominal pain, nausea and vomiting.  Genitourinary:  Negative for dysuria, flank pain and hematuria.  Musculoskeletal:  Negative for arthralgias, back pain, gait problem, joint swelling, myalgias and neck pain.  Skin:  Positive for wound. Negative for color change and rash.  Neurological:  Negative for dizziness, seizures, syncope, speech difficulty, weakness, light-headedness, numbness and headaches.  Hematological:  Does not bruise/bleed easily.  Psychiatric/Behavioral:  Negative for confusion and decreased concentration.   All other systems reviewed and are negative.  Physical Exam Updated Vital Signs BP (!) 143/75   Pulse 76   Temp 97.6 F (36.4 C) (Oral)   Resp 20   SpO2 100%   Physical Exam Vitals and nursing note reviewed.  Constitutional:      General: He is not in acute distress.    Appearance: Normal appearance. He is well-developed. He is not ill-appearing, toxic-appearing or diaphoretic.  HENT:     Head: Normocephalic.     Comments: Abrasion to right forehead above the eyebrow and right cheek.    Right Ear: External ear normal.     Left Ear: External ear normal.     Nose: Nose normal.     Mouth/Throat:     Mouth: Mucous membranes are moist.     Pharynx: Oropharynx is clear.  Eyes:     Conjunctiva/sclera: Conjunctivae normal.     Comments: Right-sided periorbital bruising  Cardiovascular:     Rate and Rhythm: Normal rate and regular rhythm.     Heart sounds: No murmur heard. Pulmonary:     Effort: Pulmonary effort is normal. No respiratory distress.     Breath sounds: Normal breath sounds.  Chest:     Chest wall: No tenderness.  Abdominal:     Palpations: Abdomen is soft.     Tenderness: There is no abdominal tenderness.  Musculoskeletal:        General: No swelling, tenderness,  deformity or signs of injury. Normal range of motion.     Cervical back: Normal range of motion and neck supple. No tenderness.  Skin:    General: Skin is warm and dry.  Neurological:     General: No focal deficit present.     Mental Status: He is alert and oriented to person, place, and time.     Cranial Nerves: No cranial nerve deficit.     Sensory: No sensory deficit.     Motor: No weakness.     Coordination: Coordination normal.  Psychiatric:        Mood and Affect: Mood normal.        Behavior: Behavior normal.  Thought Content: Thought content normal.        Judgment: Judgment normal.    ED Results / Procedures / Treatments   Labs (all labs ordered are listed, but only abnormal results are displayed) Labs Reviewed - No data to display  EKG None  Radiology CT HEAD WO CONTRAST (5MM)  Result Date: 11/24/2020 CLINICAL DATA:  Head trauma. EXAM: CT HEAD WITHOUT CONTRAST CT MAXILLOFACIAL WITHOUT CONTRAST TECHNIQUE: Multidetector CT imaging of the head and maxillofacial structures were performed using the standard protocol without intravenous contrast. Multiplanar CT image reconstructions of the maxillofacial structures were also generated. COMPARISON:  None. FINDINGS: CT HEAD FINDINGS Brain: Mild chronic ischemic white matter disease is noted. No mass effect or midline shift is noted. Ventricular size is within normal limits. There is no evidence of mass lesion, hemorrhage or acute infarction. Vascular: No hyperdense vessel or unexpected calcification. Skull: Normal. Negative for fracture or focal lesion. Other: None. CT MAXILLOFACIAL FINDINGS Osseous: No fracture or mandibular dislocation. No destructive process. Orbits: Negative. No traumatic or inflammatory finding. Sinuses: Clear. Soft tissues: Negative. IMPRESSION: No acute intracranial abnormality seen. No definite abnormality seen in maxillofacial region. Electronically Signed   By: Marijo Conception M.D.   On: 11/24/2020 13:24    CT Maxillofacial Wo Contrast  Result Date: 11/24/2020 CLINICAL DATA:  Head trauma. EXAM: CT HEAD WITHOUT CONTRAST CT MAXILLOFACIAL WITHOUT CONTRAST TECHNIQUE: Multidetector CT imaging of the head and maxillofacial structures were performed using the standard protocol without intravenous contrast. Multiplanar CT image reconstructions of the maxillofacial structures were also generated. COMPARISON:  None. FINDINGS: CT HEAD FINDINGS Brain: Mild chronic ischemic white matter disease is noted. No mass effect or midline shift is noted. Ventricular size is within normal limits. There is no evidence of mass lesion, hemorrhage or acute infarction. Vascular: No hyperdense vessel or unexpected calcification. Skull: Normal. Negative for fracture or focal lesion. Other: None. CT MAXILLOFACIAL FINDINGS Osseous: No fracture or mandibular dislocation. No destructive process. Orbits: Negative. No traumatic or inflammatory finding. Sinuses: Clear. Soft tissues: Negative. IMPRESSION: No acute intracranial abnormality seen. No definite abnormality seen in maxillofacial region. Electronically Signed   By: Marijo Conception M.D.   On: 11/24/2020 13:24    Procedures Procedures   Medications Ordered in ED Medications - No data to display  ED Course  I have reviewed the triage vital signs and the nursing notes.  Pertinent labs & imaging results that were available during my care of the patient were reviewed by me and considered in my medical decision making (see chart for details).    MDM Rules/Calculators/A&P                           Patient presents for concern of injury following a fall that occurred yesterday.  Fall was mechanical after the patient missed a step on a wooden deck.  He fell, striking the right side of his face.  He did not lose consciousness.  He has had minimal pain and swelling.  He did develop periorbital bruising on the right side.  Patient presents to the ED due to his wife's concern of serious  injury.  Prior to then bedded in the ED, patient was able to undergo CT scan of head and face.  CT scan showed no acute intracranial abnormalities and no acute facial fractures.  On assessment, patient is well-appearing.  EOM is intact.  Visual acuity is intact.  Patient has very superficial abrasions lateral  to his right eye.  Bruising is minor.  There is no significant swelling.  Patient was informed of negative CT scan result.  He was discharged in good condition.  Final Clinical Impression(s) / ED Diagnoses Final diagnoses:  Fall, initial encounter    Rx / DC Orders ED Discharge Orders     None        Godfrey Pick, MD 11/25/20 [phone]

## 2020-11-24 NOTE — ED Notes (Signed)
Dc instructions reviewed with patient.pt states understanding.

## 2021-01-17 ENCOUNTER — Ambulatory Visit: Payer: Medicare Other | Admitting: Physician Assistant

## 2021-01-28 NOTE — Progress Notes (Signed)
Cardiology Office Note   Date:  01/29/2021   ID:  Mario, [name] 05/02/1945, MRN [id number]  PCP:  Lavone Orn, MD    No chief complaint on file.  CAD  Wt Readings from Last 3 Encounters:  01/29/21 194 lb 6.4 oz (88.2 kg)  10/24/19 191 lb 3.2 oz (86.7 kg)  10/17/18 185 lb 6.4 oz (84.1 kg)       History of Present Illness: Mario [name] is a 75 y.o. male   who had an anterior MI, and BMS to the LAD in 2007-  Main sx was back pain and got pale.  He had a cardiac cath in 2012 showing a patent LAD stent. Negative stress test in 2014.    In the past, felt fatigue on metoprolol.  He takes a nap in the evening and feels better.    He had some subjective muscle loss/weakness on atorvastatin.  We switched to Crestor and he felt better.   Received COVID shots.   Denies : Chest pain. Dizziness. Leg edema. Nitroglycerin use. Orthopnea. Palpitations. Paroxysmal nocturnal dyspnea. Shortness of breath. Syncope.    Walked a mile this morning without any cardiac sx.       Past Medical History:  Diagnosis Date   Coronary artery disease    Essential hypertension, benign    Glaucoma    Myocardial infarct [facility])    Old myocardial infarction     Past Surgical History:  Procedure Laterality Date   CARPAL TUNNEL RELEASE Left 03/16/2017   Procedure: LEFT CARPAL TUNNEL RELEASE;  Surgeon: Daryll Brod, MD;  Location: [place];  Service: Orthopedics;  Laterality: Left;   CORONARY STENT PLACEMENT     rotator cuff surgery Bilateral      Current Outpatient Medications  Medication Sig Dispense Refill   aspirin EC 81 MG tablet Take 1 tablet (81 mg total) by mouth daily. 90 tablet 3   fluticasone (FLONASE) 50 MCG/ACT nasal spray USE 2 SPRAYS AS DIRECTED EVERY DAY 16 mL 6   latanoprost (XALATAN) 0.005 % ophthalmic solution Place 1 drop into both eyes at bedtime.     metoprolol tartrate (LOPRESSOR) 25 MG tablet TAKE 1 TABLET BY MOUTH TWICE A DAY 180 tablet 0    nitroGLYCERIN (NITROSTAT) 0.4 MG SL tablet Place 1 tablet (0.4 mg total) under the tongue every 5 (five) minutes as needed for chest pain (UP TO 3 DOSES BEFORE CALLING 911). 25 tablet 3   rosuvastatin (CRESTOR) 20 MG tablet TAKE 1 TABLET BY MOUTH EVERY DAY 90 tablet 0   timolol (TIMOPTIC) 0.5 % ophthalmic solution Place 1 drop into both eyes 2 (two) times daily.  1   valsartan (DIOVAN) 160 MG tablet TAKE 1 TABLET BY MOUTH EVERY DAY IN THE MORNING 90 tablet 0   No current facility-administered medications for this visit.    Allergies:   Nsaids, Zicam cold remedy [homeopathic products], Sulfa antibiotics, and Sulfasalazine    Social History:  The patient  reports that he has never smoked. He has never used smokeless tobacco. He reports that he does not drink alcohol and does not use drugs.   Family History:  The patient's family history includes CAD in his father; CVA in his father; Heart attack in his brother and father; Hypertension in his brother; Lung cancer in his mother.    ROS:  Please see the history of present illness.   Otherwise, review of systems are positive for fatigue.   All other systems  are reviewed and negative.    PHYSICAL EXAM: VS:  BP 122/70   Pulse 70   Ht 5\' 10"  (1.778 m)   Wt 194 lb 6.4 oz (88.2 kg)   SpO2 98%   BMI 27.89 kg/m  , BMI Body mass index is 27.89 kg/m. GEN: Well nourished, well developed, in no acute distress HEENT: normal Neck: no JVD, carotid bruits, or masses Cardiac: RRR; no murmurs, rubs, or gallops,no edema  Respiratory:  clear to auscultation bilaterally, normal work of breathing GI: soft, nontender, nondistended, + BS MS: no deformity or atrophy Skin: warm and dry, no rash Neuro:  Strength and sensation are intact Psych: euthymic mood, full affect   EKG:   The ekg ordered today demonstrates NSR, inf ST depressions , unchanged from prior, minimal lat ST elevation- unchanged   Recent Labs: No results found for requested labs within  last 8760 hours.   Lipid Panel    Component Value Date/Time   CHOL 117 09/07/2017 0756   TRIG 144 09/07/2017 0756   HDL 33 (L) 09/07/2017 0756   CHOLHDL 3.5 09/07/2017 0756   CHOLHDL 3 08/31/2014 0845   VLDL 25.2 08/31/2014 0845   LDLCALC 55 09/07/2017 0756     Other studies Reviewed: Additional studies/ records that were reviewed today with results demonstrating: LDL 39 in 2021.   ASSESSMENT AND PLAN:  CAD: No angina. Continue aggressive secondary prevention.  Continue metoprolol. Whole food, plant based diet.  Hyperlipidemia: The current medical regimen is effective;  continue present plan and medications. HTN: The current medical regimen is effective;  continue present plan and medications. Fatigue: we discussed w/u for OSA. He had mild sleep apnea.  Instead of CPAP, he was treated with [facility] Rx ( drums in nares).  Snoring resolved but fatigue persists.  Needs repeat labs- will get records from the [place].   Current medicines are reviewed at length with the patient today.  The patient concerns regarding his medicines were addressed.  The following changes have been made:  No change  Labs/ tests ordered today include:   Orders Placed This Encounter  Procedures   EKG 12-Lead    Recommend 150 minutes/week of aerobic exercise Low fat, low carb, high fiber diet recommended  Disposition:   FU in 1 year   Signed, Larae Grooms, MD  01/29/2021 [facility] [place], [place], [place]  [address] Phone: [phone]; Fax: [phone]

## 2021-01-29 ENCOUNTER — Ambulatory Visit: Payer: Medicare Other | Admitting: Interventional Cardiology

## 2021-01-29 ENCOUNTER — Encounter: Payer: Self-pay | Admitting: Interventional Cardiology

## 2021-01-29 ENCOUNTER — Other Ambulatory Visit: Payer: Self-pay

## 2021-01-29 VITALS — BP 122/70 | HR 70 | Ht 70.0 in | Wt 194.4 lb

## 2021-01-29 DIAGNOSIS — I25119 Atherosclerotic heart disease of native coronary artery with unspecified angina pectoris: Secondary | ICD-10-CM | POA: Diagnosis not present

## 2021-01-29 DIAGNOSIS — R5383 Other fatigue: Secondary | ICD-10-CM

## 2021-01-29 DIAGNOSIS — E782 Mixed hyperlipidemia: Secondary | ICD-10-CM

## 2021-01-29 DIAGNOSIS — I10 Essential (primary) hypertension: Secondary | ICD-10-CM

## 2021-01-29 MED ORDER — ASPIRIN EC 81 MG PO TBEC: 81.0000 mg | DELAYED_RELEASE_TABLET | Freq: Every day | ORAL | 3 refills | Status: AC

## 2021-01-29 MED ORDER — VALSARTAN 160 MG PO TABS: ORAL_TABLET | ORAL | 3 refills | Status: DC

## 2021-01-29 MED ORDER — METOPROLOL TARTRATE 25 MG PO TABS: 25.0000 mg | ORAL_TABLET | Freq: Two times a day (BID) | ORAL | 3 refills | Status: DC

## 2021-01-29 MED ORDER — ROSUVASTATIN CALCIUM 20 MG PO TABS: 20.0000 mg | ORAL_TABLET | Freq: Every day | ORAL | 3 refills | Status: DC

## 2021-01-29 MED ORDER — NITROGLYCERIN 0.4 MG SL SUBL: 0.4000 mg | SUBLINGUAL_TABLET | SUBLINGUAL | 3 refills | Status: AC | PRN

## 2021-01-29 NOTE — Patient Instructions (Signed)
Medication Instructions:  Your physician recommends that you continue on your current medications as directed. Please refer to the Current Medication list given to you today.  *If you need a refill on your cardiac medications before your next appointment, please call your pharmacy*   Lab Work: None If you have labs (blood work) drawn today and your tests are completely normal, you will receive your results only by: MyChart Message (if you have MyChart) OR A paper copy in the mail If you have any lab test that is abnormal or we need to change your treatment, we will call you to review the results.   Follow-Up: At [facility], you and your health needs are our priority.  As part of our continuing mission to provide you with exceptional heart care, we have created designated Provider Care Teams.  These Care Teams include your primary Cardiologist (physician) and Advanced Practice Providers (APPs -  Physician Assistants and Nurse Practitioners) who all work together to provide you with the care you need, when you need it.   Your next appointment:   1 year(s)  The format for your next appointment:   In Person  Provider:   Jay Varanasi, MD   

## 2022-01-21 LAB — LAB REPORT - SCANNED: A1c: 5.7

## 2022-01-26 ENCOUNTER — Other Ambulatory Visit: Payer: Self-pay | Admitting: Interventional Cardiology

## 2022-02-21 ENCOUNTER — Other Ambulatory Visit: Payer: Self-pay | Admitting: Interventional Cardiology

## 2022-02-25 ENCOUNTER — Other Ambulatory Visit: Payer: Self-pay | Admitting: Interventional Cardiology

## 2022-03-01 NOTE — Progress Notes (Signed)
Cardiology Office Note   Date:  03/02/2022   ID:  Jvion, Miazga 03-14-1946, MRN [id number]  PCP:  Kirby Funk, MD    No chief complaint on file.  CAD  Wt Readings from Last 3 Encounters:  03/02/22 195 lb (88.5 kg)  01/29/21 194 lb 6.4 oz (88.2 kg)  10/24/19 191 lb 3.2 oz (86.7 kg)       History of Present Illness: Mario [name] is a 76 y.o. male  who had an anterior MI, and BMS to the LAD in 2007-  Main sx was back pain and got pale.  He had a cardiac cath in 2012 showing a patent LAD stent. Negative stress test in 2014.    In the past, felt fatigue on metoprolol.  He takes a nap in the evening and feels better.    He had some subjective muscle loss/weakness on atorvastatin.  We switched to Crestor and he felt better.    Received COVID shots.   Denies : Chest pain. Dizziness. Leg edema. Nitroglycerin use. Orthopnea. Palpitations. Paroxysmal nocturnal dyspnea. Shortness of breath. Syncope.    Had shoulder pain.  Took advil. Held aspirin.    Past Medical History:  Diagnosis Date   Coronary artery disease    Essential hypertension, benign    Glaucoma    Myocardial infarct [facility])    Old myocardial infarction     Past Surgical History:  Procedure Laterality Date   CARPAL TUNNEL RELEASE Left 03/16/2017   Procedure: LEFT CARPAL TUNNEL RELEASE;  Surgeon: Cindee Salt, MD;  Location: [facility];  Service: Orthopedics;  Laterality: Left;   CORONARY STENT PLACEMENT     rotator cuff surgery Bilateral      Current Outpatient Medications  Medication Sig Dispense Refill   aspirin EC 81 MG tablet Take 1 tablet (81 mg total) by mouth daily. 90 tablet 3   fluticasone (FLONASE) 50 MCG/ACT nasal spray USE 2 SPRAYS AS DIRECTED EVERY DAY 16 mL 6   latanoprost (XALATAN) 0.005 % ophthalmic solution Place 1 drop into both eyes at bedtime.     metoprolol tartrate (LOPRESSOR) 25 MG tablet TAKE 1 TABLET BY MOUTH TWICE A DAY 180 tablet 0   nitroGLYCERIN  (NITROSTAT) 0.4 MG SL tablet Place 1 tablet (0.4 mg total) under the tongue every 5 (five) minutes as needed for chest pain (UP TO 3 DOSES BEFORE CALLING 911). 25 tablet 3   rosuvastatin (CRESTOR) 20 MG tablet TAKE 1 TABLET BY MOUTH EVERY DAY 90 tablet 0   timolol (TIMOPTIC) 0.5 % ophthalmic solution Place 1 drop into both eyes 2 (two) times daily.  1   valsartan (DIOVAN) 160 MG tablet TAKE 1 TABLET BY MOUTH EVERY DAY IN THE MORNING 90 tablet 0   No current facility-administered medications for this visit.    Allergies:   Nsaids, Zicam cold remedy [homeopathic products], Sulfa antibiotics, and Sulfasalazine    Social History:  The patient  reports that he has never smoked. He has never used smokeless tobacco. He reports that he does not drink alcohol and does not use drugs.   Family History:  The patient's family history includes CAD in his father; CVA in his father; Heart attack in his brother and father; Hypertension in his brother; Lung cancer in his mother.    ROS:  Please see the history of present illness.   Otherwise, review of systems are positive for shoulder pain.   All other systems are reviewed and negative.  PHYSICAL EXAM: VS:  BP 132/66   Pulse 71   Ht 5\' 10"  (1.778 m)   Wt 195 lb (88.5 kg)   SpO2 96%   BMI 27.98 kg/m  , BMI Body mass index is 27.98 kg/m. GEN: Well nourished, well developed, in no acute distress HEENT: normal Neck: no JVD, carotid bruits, or masses Cardiac: RRR; no murmurs, rubs, or gallops,no edema  Respiratory:  clear to auscultation bilaterally, normal work of breathing GI: soft, nontender, nondistended, + BS MS: no deformity or atrophy Skin: warm and dry, no rash Neuro:  Strength and sensation are intact Psych: euthymic mood, full affect   EKG:   The ekg ordered today demonstrates NSR, TWI inferolateral, no change from prior   Recent Labs: No results found for requested labs within last 365 days.   Lipid Panel    Component Value  Date/Time   CHOL 117 09/07/2017 0756   TRIG 144 09/07/2017 0756   HDL 33 (L) 09/07/2017 0756   CHOLHDL 3.5 09/07/2017 0756   CHOLHDL 3 08/31/2014 0845   VLDL 25.2 08/31/2014 0845   LDLCALC 55 09/07/2017 0756     Other studies Reviewed: Additional studies/ records that were reviewed today with results demonstrating: Cr 1.12, Potassium 4.1 in 4/23.   ASSESSMENT AND PLAN:  CAD/old MI: No angina. Continue secondary prevention.  Hyperlipidemia: Had lab work done at the [place].  He will bring this to [place].  The current medical regimen is effective;  continue present plan and medications. Hypertension: The current medical regimen is effective;  continue present plan and medications. Fatigue: Did have mild sleep apnea treated with [facility] Rx (drums in nares).  Snoring did improve.   Current medicines are reviewed at length with the patient today.  The patient concerns regarding his medicines were addressed.  The following changes have been made:  No change  Labs/ tests ordered today include:  No orders of the defined types were placed in this encounter.   Recommend 150 minutes/week of aerobic exercise Low fat, low carb, high fiber diet recommended  Disposition:   FU in 1 year   Signed, Lance Muss, MD  03/02/2022 2:56 PM    [facility] [address] [place], [place], [place]  [address] Phone: [phone]; Fax: [phone]

## 2022-03-02 ENCOUNTER — Ambulatory Visit: Payer: Medicare Other | Attending: Interventional Cardiology | Admitting: Interventional Cardiology

## 2022-03-02 VITALS — BP 132/66 | HR 71 | Ht 70.0 in | Wt 195.0 lb

## 2022-03-02 DIAGNOSIS — I10 Essential (primary) hypertension: Secondary | ICD-10-CM | POA: Diagnosis not present

## 2022-03-02 DIAGNOSIS — I25119 Atherosclerotic heart disease of native coronary artery with unspecified angina pectoris: Secondary | ICD-10-CM | POA: Diagnosis not present

## 2022-03-02 DIAGNOSIS — I252 Old myocardial infarction: Secondary | ICD-10-CM | POA: Diagnosis not present

## 2022-03-02 DIAGNOSIS — E782 Mixed hyperlipidemia: Secondary | ICD-10-CM | POA: Diagnosis not present

## 2022-03-02 NOTE — Patient Instructions (Signed)
Medication Instructions:  Your physician recommends that you continue on your current medications as directed. Please refer to the Current Medication list given to you today.  *If you need a refill on your cardiac medications before your next appointment, please call your pharmacy*   Lab Work: none If you have labs (blood work) drawn today and your tests are completely normal, you will receive your results only by: MyChart Message (if you have MyChart) OR A paper copy in the mail If you have any lab test that is abnormal or we need to change your treatment, we will call you to review the results.   Testing/Procedures: none   Follow-Up: At [facility], you and your health needs are our priority.  As part of our continuing mission to provide you with exceptional heart care, we have created designated Provider Care Teams.  These Care Teams include your primary Cardiologist (physician) and Advanced Practice Providers (APPs -  Physician Assistants and Nurse Practitioners) who all work together to provide you with the care you need, when you need it.  We recommend signing up for the patient portal called "MyChart".  Sign up information is provided on this After Visit Summary.  MyChart is used to connect with patients for Virtual Visits (Telemedicine).  Patients are able to view lab/test results, encounter notes, upcoming appointments, etc.  Non-urgent messages can be sent to your provider as well.   To learn more about what you can do with MyChart, go to [email or web address].    Your next appointment:   12 month(s)  The format for your next appointment:   In Person  Provider:   Jayadeep Varanasi, MD     Other Instructions   High-Fiber Eating Plan Fiber, also called dietary fiber, is a type of carbohydrate. It is found foods such as fruits, vegetables, whole grains, and beans. A high-fiber diet can have many health benefits. Your health care provider may recommend a  high-fiber diet to help: Prevent constipation. Fiber can make your bowel movements more regular. Lower your cholesterol. Relieve the following conditions: Inflammation of veins in the anus (hemorrhoids). Inflammation of specific areas of the digestive tract (uncomplicated diverticulosis). A problem of the large intestine, also called the colon, that sometimes causes pain and diarrhea (irritable bowel syndrome, or IBS). Prevent overeating as part of a weight-loss plan. Prevent heart disease, type 2 diabetes, and certain cancers. What are tips for following this plan? Reading food labels  Check the nutrition facts label on food products for the amount of dietary fiber. Choose foods that have 5 grams of fiber or more per serving. The goals for recommended daily fiber intake include: Men (age 50 or younger): 34-38 g. Men (over age 50): 28-34 g. Women (age 50 or younger): 25-28 g. Women (over age 50): 22-25 g. Your daily fiber goal is _____________ g. Shopping Choose whole fruits and vegetables instead of processed forms, such as apple juice or applesauce. Choose a wide variety of high-fiber foods such as avocados, lentils, oats, and kidney beans. Read the nutrition facts label of the foods you choose. Be aware of foods with added fiber. These foods often have high sugar and sodium amounts per serving. Cooking Use whole-grain flour for baking and cooking. Cook with Buckle rice instead of white rice. Meal planning Start the day with a breakfast that is high in fiber, such as a cereal that contains 5 g of fiber or more per serving. Eat breads and cereals that are made with whole-grain flour   instead of refined flour or white flour. Eat [place] rice, bulgur wheat, or millet instead of white rice. Use beans in place of meat in soups, salads, and pasta dishes. Be sure that half of the grains you eat each day are whole grains. General information You can get the recommended daily intake of dietary  fiber by: Eating a variety of fruits, vegetables, grains, nuts, and beans. Taking a fiber supplement if you are not able to take in enough fiber in your diet. It is better to get fiber through food than from a supplement. Gradually increase how much fiber you consume. If you increase your intake of dietary fiber too quickly, you may have bloating, cramping, or gas. Drink plenty of water to help you digest fiber. Choose high-fiber snacks, such as berries, raw vegetables, nuts, and popcorn. What foods should I eat? Fruits Berries. Pears. Apples. Oranges. Avocado. Prunes and raisins. Dried figs. Vegetables Sweet potatoes. Spinach. Kale. Artichokes. Cabbage. Broccoli. Cauliflower. Green peas. Carrots. Squash. Grains Whole-grain breads. Multigrain cereal. Oats and oatmeal. Graffam rice. Barley. Bulgur wheat. Millet. Quinoa. Bran muffins. Popcorn. Rye wafer crackers. Meats and other proteins Navy beans, kidney beans, and pinto beans. Soybeans. Split peas. Lentils. Nuts and seeds. Dairy Fiber-fortified yogurt. Beverages Fiber-fortified soy milk. Fiber-fortified orange juice. Other foods Fiber bars. The items listed above may not be a complete list of recommended foods and beverages. Contact a dietitian for more information. What foods should I avoid? Fruits Fruit juice. Cooked, strained fruit. Vegetables Fried potatoes. Canned vegetables. Well-cooked vegetables. Grains White bread. Pasta made with refined flour. White rice. Meats and other proteins Fatty cuts of meat. Fried chicken or fried fish. Dairy Milk. Yogurt. Cream cheese. Sour cream. Fats and oils Butters. Beverages Soft drinks. Other foods Cakes and pastries. The items listed above may not be a complete list of foods and beverages to avoid. Talk with your dietitian about what choices are best for you. Summary Fiber is a type of carbohydrate. It is found in foods such as fruits, vegetables, whole grains, and beans. A  high-fiber diet has many benefits. It can help to prevent constipation, lower blood cholesterol, aid weight loss, and reduce your risk of heart disease, diabetes, and certain cancers. Increase your intake of fiber gradually. Increasing fiber too quickly may cause cramping, bloating, and gas. Drink plenty of water while you increase the amount of fiber you consume. The best sources of fiber include whole fruits and vegetables, whole grains, nuts, seeds, and beans. This information is not intended to replace advice given to you by your health care provider. Make sure you discuss any questions you have with your health care provider. Document Revised: 07/20/2019 Document Reviewed: 07/20/2019 Elsevier Patient Education  2023 Elsevier Inc.  Important Information About Sugar       

## 2022-03-06 NOTE — Addendum Note (Signed)
Addended by: Janan Halter F on: 03/06/2022 07:23 AM   Modules accepted: Orders

## 2022-05-13 ENCOUNTER — Ambulatory Visit: Payer: Medicare Other | Admitting: Interventional Cardiology

## 2022-05-23 ENCOUNTER — Other Ambulatory Visit: Payer: Self-pay | Admitting: Interventional Cardiology

## 2022-07-27 ENCOUNTER — Telehealth: Payer: Self-pay | Admitting: Interventional Cardiology

## 2022-07-27 ENCOUNTER — Telehealth: Payer: Self-pay | Admitting: *Deleted

## 2022-07-27 ENCOUNTER — Encounter: Payer: Self-pay | Admitting: Interventional Cardiology

## 2022-07-27 ENCOUNTER — Encounter: Payer: Self-pay | Admitting: *Deleted

## 2022-07-27 ENCOUNTER — Ambulatory Visit: Payer: Medicare Other | Attending: Interventional Cardiology | Admitting: Interventional Cardiology

## 2022-07-27 VITALS — BP 148/80 | HR 63 | Ht 70.0 in | Wt 195.0 lb

## 2022-07-27 DIAGNOSIS — I25119 Atherosclerotic heart disease of native coronary artery with unspecified angina pectoris: Secondary | ICD-10-CM

## 2022-07-27 DIAGNOSIS — E782 Mixed hyperlipidemia: Secondary | ICD-10-CM | POA: Diagnosis not present

## 2022-07-27 DIAGNOSIS — I10 Essential (primary) hypertension: Secondary | ICD-10-CM

## 2022-07-27 LAB — BASIC METABOLIC PANEL
BUN/Creatinine Ratio: 12 (ref 10–24)
BUN: 14 mg/dL (ref 8–27)
CO2: 28 mmol/L (ref 20–29)
Calcium: 9.4 mg/dL (ref 8.6–10.2)
Chloride: 105 mmol/L (ref 96–106)
Creatinine, Ser: 1.16 mg/dL (ref 0.76–1.27)
Glucose: 86 mg/dL (ref 70–99)
Potassium: 4.8 mmol/L (ref 3.5–5.2)
Sodium: 141 mmol/L (ref 134–144)
eGFR: 65 mL/min/{1.73_m2} (ref >59)

## 2022-07-27 LAB — TROPONIN T: Troponin T (Highly Sensitive): 14 ng/L (ref 0–22)

## 2022-07-27 NOTE — Telephone Encounter (Signed)
Spoke with patient - having intermittent chest discomfort that lasts seconds at a time - not currently experiencing. Denies SOB, radiating, n/v, or dizziness. Patient would like to be evaluated by provider, spoke with Dr Eldridge Dace and his nurse with patient concerns. Dr Eldridge Dace would like to bring patient in to be seen this afternoon, appointment scheduled for 1 pm.

## 2022-07-27 NOTE — Addendum Note (Signed)
Addended by: Dossie Arbour on: 07/27/2022 01:59 PM   Modules accepted: Orders

## 2022-07-27 NOTE — Patient Instructions (Signed)
Medication Instructions:  Your physician recommends that you continue on your current medications as directed. Please refer to the Current Medication list given to you today.  *If you need a refill on your cardiac medications before your next appointment, please call your pharmacy*   Lab Work: STAT troponin to be done today If you have labs (blood work) drawn today and your tests are completely normal, you will receive your results only by: MyChart Message (if you have MyChart) OR A paper copy in the mail If you have any lab test that is abnormal or we need to change your treatment, we will call you to review the results.   Testing/Procedures: To be determined based on lab results   Follow-Up: At [facility], you and your health needs are our priority.  As part of our continuing mission to provide you with exceptional heart care, we have created designated Provider Care Teams.  These Care Teams include your primary Cardiologist (physician) and Advanced Practice Providers (APPs -  Physician Assistants and Nurse Practitioners) who all work together to provide you with the care you need, when you need it.  We recommend signing up for the patient portal called "MyChart".  Sign up information is provided on this After Visit Summary.  MyChart is used to connect with patients for Virtual Visits (Telemedicine).  Patients are able to view lab/test results, encounter notes, upcoming appointments, etc.  Non-urgent messages can be sent to your provider as well.   To learn more about what you can do with MyChart, go to [email or web address].    Your next appointment:   As planned  Provider:   Lance Muss, MD     Other Instructions

## 2022-07-27 NOTE — Progress Notes (Signed)
Cardiology Office Note   Date:  07/27/2022   ID:  Mario [name] 1945-08-01, MRN [id number]  PCP:  Mario Funk, MD    No chief complaint on file.  CAD  Wt Readings from Last 3 Encounters:  07/27/22 195 lb (88.5 kg)  03/02/22 195 lb (88.5 kg)  01/29/21 194 lb 6.4 oz (88.2 kg)       History of Present Illness: Mario [name] is a 77 y.o. 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 He had a cardiac cath in 2012 showing a patent LAD stent. Negative stress test in 2014.      In the past, felt fatigue on metoprolol.  He takes a nap in the evening and feels better.    He had some subjective muscle loss/weakness on atorvastatin.  We switched to Crestor and he felt better.    Received COVID shots.   Reported some symptoms occurring in April 2024: "having intermittent chest discomfort that lasts seconds at a time - not currently experiencing. Denies SOB, radiating, n/v, or dizziness. 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  He was trying to loosen tight lugnuts.  He strained very heavily.  A few days later, he started having some very brief sensations in the left lower back.  These feelings have persisted, but getting less frequent.  Had some shoulder pain after this time as well.  More at rest.  Less likely with exertion.     Past Medical History:  Diagnosis Date   Coronary artery disease    Essential hypertension, benign    Glaucoma    Myocardial infarct [facility])    Old myocardial infarction     Past Surgical History:  Procedure Laterality Date   CARPAL TUNNEL RELEASE Left 03/16/2017   Procedure: LEFT CARPAL TUNNEL RELEASE;  Surgeon: Cindee Salt, MD;  Location: [facility];  Service: Orthopedics;  Laterality: Left;   CORONARY STENT PLACEMENT     rotator cuff surgery Bilateral      Current Outpatient  Medications  Medication Sig Dispense Refill   aspirin EC 81 MG tablet Take 1 tablet (81 mg total) by mouth daily. 90 tablet 3   fluticasone (FLONASE) 50 MCG/ACT nasal spray USE 2 SPRAYS AS DIRECTED EVERY DAY 16 mL 6   ibuprofen (ADVIL) 200 MG tablet Take 200 mg by mouth every 6 (six) hours as needed for moderate pain or headache.     latanoprost (XALATAN) 0.005 % ophthalmic solution Place 1 drop into both eyes at bedtime.     metoprolol tartrate (LOPRESSOR) 25 MG tablet TAKE 1 TABLET BY MOUTH TWICE A DAY 180 tablet 0   neomycin-polymyxin-hydrocortisone (CORTISPORIN) 3.5-10000-1 OTIC suspension Place into both ears as directed.     nitroGLYCERIN (NITROSTAT) 0.4 MG SL tablet Place 1 tablet (0.4 mg total) under the tongue every 5 (five) minutes as needed for chest pain (UP TO 3 DOSES BEFORE CALLING 911). 25 tablet 3   rosuvastatin (CRESTOR) 20 MG tablet TAKE 1 TABLET BY MOUTH EVERY DAY 90 tablet 0   timolol (TIMOPTIC) 0.5 % ophthalmic solution Place 1 drop into both eyes 2 (two) times daily.  1   valsartan (DIOVAN) 160 MG tablet TAKE 1 TABLET BY MOUTH EVERY DAY IN THE MORNING 90 tablet 0  No current facility-administered medications for this visit.    Allergies:   Nsaids, Zicam cold remedy [homeopathic products], Atorvastatin calcium, Sulfa antibiotics, and Sulfasalazine    Social History:  The patient  reports that he has never smoked. He has never used smokeless tobacco. He reports that he does not drink alcohol and does not use drugs.   Family History:  The patient's family history includes CAD in his father; CVA in his father; Heart attack in his brother and father; Hypertension in his brother; Lung cancer in his mother.    ROS:  Please see the history of present illness.   Otherwise, review of systems are positive for back pain.   All other systems are reviewed and negative.    PHYSICAL EXAM: VS:  BP (!) 148/80   Pulse 63   Ht 5\' 10"  (1.778 m)   Wt 195 lb (88.5 kg)   SpO2 96%    BMI 27.98 kg/m  , BMI Body mass index is 27.98 kg/m. GEN: Well nourished, well developed, in no acute distress HEENT: normal Neck: no JVD, carotid bruits, or masses Cardiac: RRR; no murmurs, rubs, or gallops,no edema  Respiratory:  clear to auscultation bilaterally, normal work of breathing GI: soft, nontender, nondistended, + BS MS: no deformity or atrophy Skin: warm and dry, no rash Neuro:  Strength and sensation are intact Psych: euthymic mood, full affect   EKG:   The ekg ordered today demonstrates    Recent Labs: No results found for requested labs within last 365 days.   Lipid Panel    Component Value Date/Time   CHOL 117 09/07/2017 0756   TRIG 144 09/07/2017 0756   HDL 33 (L) 09/07/2017 0756   CHOLHDL 3.5 09/07/2017 0756   CHOLHDL 3 08/31/2014 0845   VLDL 25.2 08/31/2014 0845   LDLCALC 55 09/07/2017 0756     Other studies Reviewed: Additional studies/ records that were reviewed today with results demonstrating: .   ASSESSMENT AND PLAN:  CAD/Old MI: Last ischemic evaluation was in 2014.  Symptoms described today are not like his prior cardiac event.  We discussed options including cath versus stress test.  ECG is unchanged.  He does have some baseline ST depressions but they have been present for years.  Will check troponin.  If it is elevated, you will have to go to the hospital with plan for cath tomorrow.  If it is negative, we can plan for a stress test since it has been many years since his last ischemic evaluation. Hyperlipidemia: LDL 46 in October 2023.  Continue rosuvastatin. Hypertension: Blood pressure mildly increased today.  Continue valsartan.  Will see some home readings prior to making any medication changes.   Current medicines are reviewed at length with the patient today.  The patient concerns regarding his medicines were addressed.  The following changes have been made:  No change  Labs/ tests ordered today include: troponin, BMet No orders  of the defined types were placed in this encounter.   Recommend 150 minutes/week of aerobic exercise Low fat, low carb, high fiber diet recommended  Disposition:   FU as scheduled   Signed, Lance Muss, MD  07/27/2022 1:25 PM    [facility] [address] [place], [place], [place]  [address] Phone: [phone]; Fax: [phone]

## 2022-07-27 NOTE — Telephone Encounter (Signed)
Stress test instructions reviewed with patient.  Instructions mailed to him.

## 2022-07-27 NOTE — Telephone Encounter (Signed)
Pt c/o of Chest Pain: STAT if CP now or developed within 24 hours  1. Are you having CP right now?  No   2. Are you experiencing any other symptoms (ex. SOB, nausea, vomiting, sweating)?  More tired recently  3. How long have you been experiencing CP?  Since 4/24  4. Is your CP continuous or coming and going?  Coming and going   5. Have you taken Nitroglycerin?  No  ?

## 2022-07-30 ENCOUNTER — Telehealth (HOSPITAL_COMMUNITY): Payer: Self-pay | Admitting: *Deleted

## 2022-07-30 NOTE — Telephone Encounter (Signed)
Patient given detailed instructions per Myocardial Perfusion Study Information Sheet for the test on 08/03/2022 at 10:30. Patient notified to arrive 15 minutes early and that it is imperative to arrive on time for appointment to keep from having the test rescheduled.  If you need to cancel or reschedule your appointment, please call the office within 24 hours of your appointment. . Patient verbalized understanding.Daneil Dolin

## 2022-08-03 ENCOUNTER — Ambulatory Visit (HOSPITAL_COMMUNITY): Payer: Medicare Other | Attending: Interventional Cardiology

## 2022-08-03 ENCOUNTER — Encounter (HOSPITAL_COMMUNITY): Payer: Self-pay

## 2022-08-03 DIAGNOSIS — I25119 Atherosclerotic heart disease of native coronary artery with unspecified angina pectoris: Secondary | ICD-10-CM | POA: Insufficient documentation

## 2022-08-03 LAB — MYOCARDIAL PERFUSION IMAGING
Angina Index: 0
Duke Treadmill Score: -2
Estimated workload: 9.4
Exercise duration (min): 7 min
Exercise duration (sec): 35 s
LV dias vol: 63 mL (ref 62–150)
LV sys vol: 25 mL
MPHR: 143 {beats}/min
Nuc Stress EF: 61 %
Peak HR: 141 {beats}/min
Percent HR: 98 %
Rest HR: 63 {beats}/min
Rest Nuclear Isotope Dose: 10.3 mCi
SDS: 1
SRS: 0
SSS: 1
ST Depression (mm): 2 mm
Stress Nuclear Isotope Dose: 31.8 mCi
TID: 0.99

## 2022-08-03 MED ORDER — TECHNETIUM TC 99M TETROFOSMIN IV KIT
10.3000 | PACK | Freq: Once | INTRAVENOUS | Status: AC | PRN
  Administered 2022-08-03: 10.3 via INTRAVENOUS

## 2022-08-03 MED ORDER — TECHNETIUM TC 99M TETROFOSMIN IV KIT
31.8000 | PACK | Freq: Once | INTRAVENOUS | Status: AC | PRN
  Administered 2022-08-03: 31.8 via INTRAVENOUS

## 2022-08-20 ENCOUNTER — Other Ambulatory Visit: Payer: Self-pay | Admitting: Interventional Cardiology

## 2022-11-13 ENCOUNTER — Encounter: Payer: Self-pay | Admitting: Interventional Cardiology

## 2022-11-15 ENCOUNTER — Other Ambulatory Visit: Payer: Self-pay | Admitting: Interventional Cardiology

## 2023-01-14 ENCOUNTER — Other Ambulatory Visit: Payer: Self-pay | Admitting: Orthopedic Surgery

## 2023-01-14 ENCOUNTER — Telehealth: Payer: Self-pay | Admitting: Interventional Cardiology

## 2023-01-14 NOTE — Telephone Encounter (Signed)
   Pre-operative Risk Assessment    Patient Name: Mario [name]  DOB: December 17, 1945 MRN: [id number]      Request for Surgical Clearance    Procedure:   Right repeat carpal tunnel release, possible fat pad transfer  Date of Surgery:  Clearance 03/04/23                                 Surgeon:  Dr. [name] Surgeon's Group or Practice Name:  [facility] Phone number:  [phone]  Fax number:  [phone]   Type of Clearance Requested:   - Pharmacy:  Hold Aspirin     Type of Anesthesia:   Choic   Additional requests/questions:   Caller [facility]) stated they will need to know how long to hold patient's aspirin.  Signed, Annetta Maw   01/14/2023, 2:30 PM

## 2023-01-15 ENCOUNTER — Telehealth: Payer: Self-pay

## 2023-01-15 NOTE — Telephone Encounter (Signed)
Spoke with patient who is agreeable to do a tele visit on 11/13 at 1:40 pm. Consent done, med rec to be completed.

## 2023-01-15 NOTE — Telephone Encounter (Signed)
   Name: Mario [name]  DOB: 05-08-1945  MRN: [id number]  Primary Cardiologist: Lance Muss, MD   Preoperative team, please contact this patient and set up a phone call appointment for further preoperative risk assessment. Please obtain consent and complete medication review. Thank you for your help.  I confirm that guidance regarding antiplatelet and oral anticoagulation therapy has been completed and, if necessary, noted below.  Ideally aspirin should be continued without interruption, however if the bleeding risk is too great, aspirin may be held for 5-7 days prior to surgery. Please resume aspirin post operatively when it is felt to be safe from a bleeding standpoint.    I also confirmed the patient resides in the state of [place]. As per [facility] Medical Board telemedicine laws, the patient must reside in the state in which the provider is licensed.   Carlos Levering, NP 01/15/2023, 8:05 AM [facility]

## 2023-01-15 NOTE — Telephone Encounter (Signed)
  Patient Consent for Virtual Visit        Mario [name] has provided verbal consent on 01/15/2023 for a virtual visit (video or telephone).   CONSENT FOR VIRTUAL VISIT FOR:  Mario [name]  By participating in this virtual visit I agree to the following:  I hereby voluntarily request, consent and authorize [facility] and its employed or contracted physicians, physician assistants, nurse practitioners or other licensed health care professionals (the Practitioner), to provide me with telemedicine health care services (the "Services") as deemed necessary by the treating Practitioner. I acknowledge and consent to receive the Services by the Practitioner via telemedicine. I understand that the telemedicine visit will involve communicating with the Practitioner through live audiovisual communication technology and the disclosure of certain medical information by electronic transmission. I acknowledge that I have been given the opportunity to request an in-person assessment or other available alternative prior to the telemedicine visit and am voluntarily participating in the telemedicine visit.  I understand that I have the right to withhold or withdraw my consent to the use of telemedicine in the course of my care at any time, without affecting my right to future care or treatment, and that the Practitioner or I may terminate the telemedicine visit at any time. I understand that I have the right to inspect all information obtained and/or recorded in the course of the telemedicine visit and may receive copies of available information for a reasonable fee.  I understand that some of the potential risks of receiving the Services via telemedicine include:  Delay or interruption in medical evaluation due to technological equipment failure or disruption; Information transmitted may not be sufficient (e.g. poor resolution of images) to allow for appropriate medical decision making by the Practitioner;  and/or  In rare instances, security protocols could fail, causing a breach of personal health information.  Furthermore, I acknowledge that it is my responsibility to provide information about my medical history, conditions and care that is complete and accurate to the best of my ability. I acknowledge that Practitioner's advice, recommendations, and/or decision may be based on factors not within their control, such as incomplete or inaccurate data provided by me or distortions of diagnostic images or specimens that may result from electronic transmissions. I understand that the practice of medicine is not an exact science and that Practitioner makes no warranties or guarantees regarding treatment outcomes. I acknowledge that a copy of this consent can be made available to me via my patient portal [facility] MyChart), or I can request a printed copy by calling the office of [facility].    I understand that my insurance will be billed for this visit.   I have read or had this consent read to me. I understand the contents of this consent, which adequately explains the benefits and risks of the Services being provided via telemedicine.  I have been provided ample opportunity to ask questions regarding this consent and the Services and have had my questions answered to my satisfaction. I give my informed consent for the services to be provided through the use of telemedicine in my medical care

## 2023-02-09 NOTE — Progress Notes (Unsigned)
Virtual Visit via Telephone Note   Because of Mario [name]'s co-morbid illnesses, he is at least at moderate risk for complications without adequate follow up.  This format is felt to be most appropriate for this patient at this time.  The patient did not have access to video technology/had technical difficulties with video requiring transitioning to audio format only (telephone).  All issues noted in this document were discussed and addressed.  No physical exam could be performed with this format.  Please refer to the patient's chart for his consent to telehealth for [facility].  Evaluation Performed:  Preoperative cardiovascular risk assessment _____________   Date:  02/10/2023   Patient ID:  DEAUNDRE [name], DOB 12-Mar-1946, MRN [id number] Patient Location:  Home Provider location:   Office  Primary Care Provider:  Kirby Funk, MD (Inactive) Primary Cardiologist:  Lance Muss, MD (Needs to be assigned)  Chief Complaint / Patient Profile   77 y.o. y/o male with a h/o coronary artery disease, status post anterior MI and bare-metal stent to the LAD in 2007, cardiac cath 2012 showing patent LAD stent.  Normal nuclear medicine stress test on 08/04/2022 with no evidence of ischemia.   He is pending right carpal tunnel release and possible fat pad transfer by Dr. Merlyn Lot on 03/04/2023, and presents today for telephonic preoperative cardiovascular risk assessment.  History of Present Illness    Mario [name] is a 77 y.o. male who presents via audio/video conferencing for a telehealth visit today.  Pt was last seen in cardiology clinic on 07/27/2022 by Dr. Eldridge Dace.  At that time YELTSIN HISHMEH was doing well   The patient is now pending procedure as outlined above. Since his last visit, he he has done well from a cardiac standpoint.  He denies any chest pain, shortness of breath, dizziness, or profound fatigue.  He is able to climb stairs, be active with the restriction concerning  his right wrist.  He has been medically compliant.  Past Medical History    Past Medical History:  Diagnosis Date   Coronary artery disease    Essential hypertension, benign    Glaucoma    Myocardial infarct [facility])    Old myocardial infarction    Past Surgical History:  Procedure Laterality Date   CARPAL TUNNEL RELEASE Left 03/16/2017   Procedure: LEFT CARPAL TUNNEL RELEASE;  Surgeon: Cindee Salt, MD;  Location: [facility];  Service: Orthopedics;  Laterality: Left;   CORONARY STENT PLACEMENT     rotator cuff surgery Bilateral     Allergies  Allergies  Allergen Reactions   Nsaids Other (See Comments)    Unknown reaction   Zicam Cold Remedy [Homeopathic Products] Other (See Comments)    Unknown reaction   Atorvastatin Calcium     Other Reaction(s): muscles heavy and weak   Sulfa Antibiotics Rash   Sulfasalazine Rash    Home Medications    Prior to Admission medications   Medication Sig Start Date End Date Taking? Authorizing Provider  aspirin EC 81 MG tablet Take 1 tablet (81 mg total) by mouth daily. 01/29/21   Corky Crafts, MD  fluticasone [facility]) 50 MCG/ACT nasal spray USE 2 SPRAYS AS DIRECTED EVERY DAY 01/23/20   Drema Halon, MD  ibuprofen (ADVIL) 200 MG tablet Take 200 mg by mouth every 6 (six) hours as needed for moderate pain or headache.    [provider]  latanoprost (XALATAN) 0.005 % ophthalmic solution Place 1 drop into  both eyes at bedtime.    [provider]  metoprolol tartrate (LOPRESSOR) 25 MG tablet TAKE 1 TABLET BY MOUTH TWICE A DAY 11/17/22   Corky Crafts, MD  neomycin-polymyxin-hydrocortisone (CORTISPORIN) 3.5-10000-1 OTIC suspension Place into both ears as directed. 06/19/22   [provider]  nitroGLYCERIN (NITROSTAT) 0.4 MG SL tablet Place 1 tablet (0.4 mg total) under the tongue every 5 (five) minutes as needed for chest pain (UP TO 3 DOSES BEFORE CALLING 911). 01/29/21   Corky Crafts, MD  rosuvastatin (CRESTOR) 20 MG tablet TAKE 1 TABLET BY MOUTH EVERY DAY 11/17/22   Corky Crafts, MD  timolol (TIMOPTIC) 0.5 % ophthalmic solution Place 1 drop into both eyes 2 (two) times daily. 06/18/16   [provider]  valsartan (DIOVAN) 160 MG tablet TAKE 1 TABLET BY MOUTH EVERY DAY IN THE MORNING 11/17/22   Corky Crafts, MD    Physical Exam    Vital Signs:  JAYSTIN OVERGAARD does not have vital signs available for review today.  Given telephonic nature of communication, physical exam is limited. AAOx3. NAD. Normal affect.  Speech and respirations are unlabored.  Accessory Clinical Findings    None  Assessment & Plan    1.  Preoperative Cardiovascular Risk Assessment:  According to the Revised Cardiac Risk Index (RCRI), his Perioperative Risk of Major Cardiac Event is (%): 6.6  His Functional Capacity in METs is: 8.23 according to the Duke Activity Status Index (DASI).   The patient was advised that if he develops new symptoms prior to surgery to contact our office to arrange for a follow-up visit, and he verbalized understanding.  Per office protocol, if patient is without any new symptoms or concerns at the time of their virtual visit, he/she may hold ASA for 7 days prior to procedure. Please resume ASA as soon as possible postprocedure, at the discretion of the surgeon.    A copy of this note will be routed to requesting surgeon.   Time:   Today, I have spent 10 minutes with the patient with telehealth technology discussing medical history, symptoms, and management plan.     Joni Reining, NP  02/10/2023, 1:45 PM

## 2023-02-10 ENCOUNTER — Ambulatory Visit: Payer: Medicare Other | Attending: Cardiology

## 2023-02-10 DIAGNOSIS — Z01810 Encounter for preprocedural cardiovascular examination: Secondary | ICD-10-CM

## 2023-02-10 DIAGNOSIS — Z01818 Encounter for other preprocedural examination: Secondary | ICD-10-CM

## 2023-02-23 ENCOUNTER — Other Ambulatory Visit: Payer: Self-pay

## 2023-02-23 ENCOUNTER — Encounter (HOSPITAL_BASED_OUTPATIENT_CLINIC_OR_DEPARTMENT_OTHER): Payer: Self-pay | Admitting: Orthopedic Surgery

## 2023-03-02 NOTE — Progress Notes (Signed)

## 2023-03-04 ENCOUNTER — Ambulatory Visit (HOSPITAL_BASED_OUTPATIENT_CLINIC_OR_DEPARTMENT_OTHER)
Admission: RE | Admit: 2023-03-04 | Discharge: 2023-03-04 | Disposition: A | Discharge status: Home / Self Care | Payer: Medicare Other | Attending: Orthopedic Surgery | Admitting: Orthopedic Surgery

## 2023-03-04 ENCOUNTER — Encounter (HOSPITAL_BASED_OUTPATIENT_CLINIC_OR_DEPARTMENT_OTHER)
Admission: RE | Disposition: A | Discharge status: Home / Self Care | Payer: Self-pay | Source: Home / Self Care | Attending: Orthopedic Surgery

## 2023-03-04 ENCOUNTER — Other Ambulatory Visit: Payer: Self-pay

## 2023-03-04 ENCOUNTER — Ambulatory Visit (HOSPITAL_BASED_OUTPATIENT_CLINIC_OR_DEPARTMENT_OTHER): Payer: Medicare Other | Admitting: Anesthesiology

## 2023-03-04 ENCOUNTER — Encounter (HOSPITAL_BASED_OUTPATIENT_CLINIC_OR_DEPARTMENT_OTHER): Payer: Self-pay | Admitting: Orthopedic Surgery

## 2023-03-04 DIAGNOSIS — I252 Old myocardial infarction: Secondary | ICD-10-CM | POA: Insufficient documentation

## 2023-03-04 DIAGNOSIS — G5601 Carpal tunnel syndrome, right upper limb: Secondary | ICD-10-CM | POA: Insufficient documentation

## 2023-03-04 DIAGNOSIS — I10 Essential (primary) hypertension: Secondary | ICD-10-CM | POA: Diagnosis not present

## 2023-03-04 DIAGNOSIS — I2510 Atherosclerotic heart disease of native coronary artery without angina pectoris: Secondary | ICD-10-CM | POA: Insufficient documentation

## 2023-03-04 HISTORY — PX: CARPAL TUNNEL RELEASE: SHX101

## 2023-03-04 SURGERY — CARPAL TUNNEL RELEASE
Anesthesia: General | Laterality: Right

## 2023-03-04 MED ORDER — FENTANYL CITRATE (PF) 100 MCG/2ML IJ SOLN: 25.0000 ug | INTRAMUSCULAR | Status: DC | PRN

## 2023-03-04 MED ORDER — FENTANYL CITRATE (PF) 250 MCG/5ML IJ SOLN
INTRAMUSCULAR | Status: DC | PRN
  Administered 2023-03-04 (×2): 50 ug via INTRAVENOUS

## 2023-03-04 MED ORDER — CEFAZOLIN SODIUM-DEXTROSE 2-4 GM/100ML-% IV SOLN
2.0000 g | INTRAVENOUS | Status: AC
  Administered 2023-03-04: 2 g via INTRAVENOUS

## 2023-03-04 MED ORDER — EPHEDRINE SULFATE-NACL 50-0.9 MG/10ML-% IV SOSY
PREFILLED_SYRINGE | INTRAVENOUS | Status: DC | PRN
  Administered 2023-03-04: 10 mg via INTRAVENOUS
  Administered 2023-03-04: 5 mg via INTRAVENOUS
  Administered 2023-03-04 (×3): 10 mg via INTRAVENOUS

## 2023-03-04 MED ORDER — OXYCODONE HCL 5 MG PO TABS: 5.0000 mg | ORAL_TABLET | Freq: Once | ORAL | Status: DC | PRN

## 2023-03-04 MED ORDER — SODIUM CHLORIDE 0.9 % IV SOLN: INTRAVENOUS | Status: DC | PRN

## 2023-03-04 MED ORDER — MEPERIDINE HCL 25 MG/ML IJ SOLN: 6.2500 mg | INTRAMUSCULAR | Status: DC | PRN

## 2023-03-04 MED ORDER — DEXAMETHASONE SODIUM PHOSPHATE 10 MG/ML IJ SOLN
INTRAMUSCULAR | Status: DC | PRN
  Administered 2023-03-04: 10 mg via INTRAVENOUS

## 2023-03-04 MED ORDER — GLYCOPYRROLATE PF 0.2 MG/ML IJ SOSY
PREFILLED_SYRINGE | INTRAMUSCULAR | Status: AC
  Filled 2023-03-04: qty 1

## 2023-03-04 MED ORDER — EPHEDRINE 5 MG/ML INJ
INTRAVENOUS | Status: AC
  Filled 2023-03-04: qty 5

## 2023-03-04 MED ORDER — LIDOCAINE 2% (20 MG/ML) 5 ML SYRINGE
INTRAMUSCULAR | Status: DC | PRN
  Administered 2023-03-04: 100 mg via INTRAVENOUS

## 2023-03-04 MED ORDER — PROPOFOL 10 MG/ML IV BOLUS
INTRAVENOUS | Status: DC | PRN
  Administered 2023-03-04: 200 mg via INTRAVENOUS

## 2023-03-04 MED ORDER — GLYCOPYRROLATE 0.2 MG/ML IJ SOLN
INTRAMUSCULAR | Status: DC | PRN
  Administered 2023-03-04: .2 mg via INTRAVENOUS

## 2023-03-04 MED ORDER — ACETAMINOPHEN 325 MG PO TABS: 325.0000 mg | ORAL_TABLET | ORAL | Status: DC | PRN

## 2023-03-04 MED ORDER — DEXAMETHASONE SODIUM PHOSPHATE 10 MG/ML IJ SOLN
INTRAMUSCULAR | Status: AC
  Filled 2023-03-04: qty 1

## 2023-03-04 MED ORDER — CEFAZOLIN SODIUM-DEXTROSE 2-4 GM/100ML-% IV SOLN
INTRAVENOUS | Status: AC
  Filled 2023-03-04: qty 100

## 2023-03-04 MED ORDER — ONDANSETRON HCL 4 MG/2ML IJ SOLN
INTRAMUSCULAR | Status: AC
Start: 2023-03-04 — End: ?
  Filled 2023-03-04: qty 2

## 2023-03-04 MED ORDER — PROPOFOL 10 MG/ML IV BOLUS
INTRAVENOUS | Status: AC
Start: 2023-03-04 — End: ?
  Filled 2023-03-04: qty 20

## 2023-03-04 MED ORDER — BUPIVACAINE HCL (PF) 0.25 % IJ SOLN
INTRAMUSCULAR | Status: DC | PRN
  Administered 2023-03-04: 9 mL

## 2023-03-04 MED ORDER — FENTANYL CITRATE (PF) 100 MCG/2ML IJ SOLN
INTRAMUSCULAR | Status: AC
  Filled 2023-03-04: qty 2

## 2023-03-04 MED ORDER — TRAMADOL HCL 50 MG PO TABS: ORAL_TABLET | ORAL | 0 refills | Status: AC

## 2023-03-04 MED ORDER — ACETAMINOPHEN 160 MG/5ML PO SOLN: 325.0000 mg | ORAL | Status: DC | PRN

## 2023-03-04 MED ORDER — OXYCODONE HCL 5 MG/5ML PO SOLN: 5.0000 mg | Freq: Once | ORAL | Status: DC | PRN

## 2023-03-04 MED ORDER — LIDOCAINE 2% (20 MG/ML) 5 ML SYRINGE
INTRAMUSCULAR | Status: AC
  Filled 2023-03-04: qty 5

## 2023-03-04 MED ORDER — ONDANSETRON HCL 4 MG/2ML IJ SOLN: 4.0000 mg | Freq: Once | INTRAMUSCULAR | Status: DC | PRN

## 2023-03-04 MED ORDER — LACTATED RINGERS IV SOLN: INTRAVENOUS | Status: DC

## 2023-03-04 SURGICAL SUPPLY — 29 items
BLADE SURG 15 STRL LF DISP TIS (BLADE) ×4 IMPLANT
BNDG ELASTIC 3INX 5YD STR LF (GAUZE/BANDAGES/DRESSINGS) ×2 IMPLANT
BNDG ESMARK 4X9 LF (GAUZE/BANDAGES/DRESSINGS) IMPLANT
BNDG GAUZE DERMACEA FLUFF 4 (GAUZE/BANDAGES/DRESSINGS) ×2 IMPLANT
CHLORAPREP W/TINT 26 (MISCELLANEOUS) ×2 IMPLANT
CORD BIPOLAR FORCEPS 12FT (ELECTRODE) ×2 IMPLANT
COVER BACK TABLE 60X90IN (DRAPES) ×2 IMPLANT
COVER MAYO STAND STRL (DRAPES) ×2 IMPLANT
CUFF TOURN SGL QUICK 18X4 (TOURNIQUET CUFF) ×2 IMPLANT
DRAPE EXTREMITY T 121X128X90 (DISPOSABLE) ×2 IMPLANT
DRAPE SURG 17X23 STRL (DRAPES) ×2 IMPLANT
GAUZE PAD ABD 8X10 STRL (GAUZE/BANDAGES/DRESSINGS) ×2 IMPLANT
GAUZE SPONGE 4X4 12PLY STRL (GAUZE/BANDAGES/DRESSINGS) ×2 IMPLANT
GAUZE XEROFORM 1X8 LF (GAUZE/BANDAGES/DRESSINGS) ×2 IMPLANT
GLOVE BIO SURGEON STRL SZ7.5 (GLOVE) ×2 IMPLANT
GLOVE BIOGEL PI IND STRL 8 (GLOVE) ×2 IMPLANT
GOWN STRL REUS W/ TWL LRG LVL3 (GOWN DISPOSABLE) ×2 IMPLANT
GOWN STRL REUS W/TWL XL LVL3 (GOWN DISPOSABLE) ×2 IMPLANT
NDL HYPO 25X1 1.5 SAFETY (NEEDLE) ×2 IMPLANT
NEEDLE HYPO 25X1 1.5 SAFETY (NEEDLE) ×1
NS IRRIG 1000ML POUR BTL (IV SOLUTION) ×2 IMPLANT
PACK BASIN DAY SURGERY FS (CUSTOM PROCEDURE TRAY) ×2 IMPLANT
PADDING CAST ABS COTTON 4X4 ST (CAST SUPPLIES) ×2 IMPLANT
STOCKINETTE 4X48 STRL (DRAPES) ×2 IMPLANT
SUT ETHILON 4 0 PS 2 18 (SUTURE) ×2 IMPLANT
SYR BULB EAR ULCER 3OZ GRN STR (SYRINGE) ×2 IMPLANT
SYR CONTROL 10ML LL (SYRINGE) ×2 IMPLANT
TOWEL GREEN STERILE FF (TOWEL DISPOSABLE) ×4 IMPLANT
UNDERPAD 30X36 HEAVY ABSORB (UNDERPADS AND DIAPERS) ×2 IMPLANT

## 2023-03-04 NOTE — Anesthesia Procedure Notes (Signed)
Procedure Name: LMA Insertion Date/Time: 03/04/2023 9:14 AM  Performed by: Roosvelt Harps, CRNAPre-anesthesia Checklist: Patient identified, Emergency Drugs available, Suction available, Patient being monitored and Timeout performed Patient Re-evaluated:Patient Re-evaluated prior to induction Oxygen Delivery Method: Circle system utilized Preoxygenation: Pre-oxygenation with 100% oxygen Induction Type: IV induction Ventilation: Mask ventilation without difficulty LMA: LMA inserted LMA Size: 5.0 Number of attempts: 1 Placement Confirmation: positive ETCO2 and breath sounds checked- equal and bilateral Dental Injury: Teeth and Oropharynx as per pre-operative assessment

## 2023-03-04 NOTE — Op Note (Signed)
03/04/2023 [facility]                              OPERATIVE REPORT   PREOPERATIVE DIAGNOSIS:  Right carpal tunnel syndrome.  POSTOPERATIVE DIAGNOSIS:  Right carpal tunnel syndrome.  PROCEDURE:  Right carpal tunnel release.  SURGEON:  [name], MD  ASSISTANT: Cindee Salt, MD.  ANESTHESIA: General  IV FLUIDS:  Per anesthesia flow sheet.  ESTIMATED BLOOD LOSS:  Minimal.  COMPLICATIONS:  None.  SPECIMENS:  None.  TOURNIQUET TIME:    Total Tourniquet Time Documented: Upper Arm (Right) - 19 minutes Total: Upper Arm (Right) - 19 minutes   DISPOSITION:  Stable to PACU.  LOCATION:  SURGERY CENTER  INDICATIONS:  77 y.o. yo male with numbness and tingling right hand.  Nocturnal symptoms. Positive nerve conduction studies. He wishes to proceed with right carpal tunnel release.  He states he has had right carpal tunnel release in the past.  Risks, benefits and alternatives of surgery were discussed including the risk of blood loss; infection; damage to nerves, vessels, tendons, ligaments, bone; failure of surgery; need for additional surgery; complications with wound healing; continued pain; recurrence of carpal tunnel syndrome; and damage to motor branch. He voiced understanding of these risks and elected to proceed.   OPERATIVE COURSE:  After being identified preoperatively by myself, the patient and I agreed upon the procedure and site of procedure.  The surgical site was marked.  Surgical consent had been signed.  He was given IV Ancef as preoperative antibiotic prophylaxis.  He was transferred to the operating room and placed on the operating room table in supine position with the Right upper extremity on an armboard.  General anesthesia was induced by the anesthesiologist.  Right upper extremity was prepped and draped in normal sterile orthopaedic fashion.  A surgical pause was performed between the surgeons, anesthesia, and operating room staff, and all  were in agreement as to the patient, procedure, and site of procedure.  Tourniquet at the proximal aspect of the extremity was inflated to 250 mmHg after exsanguination of the arm with an Esmarch bandage  Incision was made over the transverse carpal ligament and carried into the subcutaneous tissues by spreading technique.  This was extended over the distal forearm proximal to the wrist crease.  Bipolar electrocautery was used to obtain hemostasis.  The median nerve was identified the proximal aspect of the wound.  It was then carefully traced distally.  The knife was used to release the palmar fascia and then the transverse carpal ligament from proximal to distal under direct visualization while protecting the nerve.  The nerve was completely decompressed.  The motor branch was identified and was intact.  There was hourglass deformity to the nerve.  The wound was copiously irrigated with sterile saline.  It was then closed with 4-0 nylon in a horizontal mattress fashion.  It was injected with 0.25% plain Marcaine to aid in postoperative analgesia.  It was dressed with sterile Xeroform, 4x4s, an ABD, and wrapped with Kerlix and an Ace bandage.  Tourniquet was deflated at 19 minutes.  Fingertips were pink with brisk capillary refill after deflation of the tourniquet.  Operative drapes were broken down.  The patient was awoken from anesthesia safely.  He was transferred back to stretcher and taken to the PACU in stable condition.  I will see him back in the office in 1 week for postoperative followup.  I will give him a prescription for Tramadol 50 mg 1-2 tabs PO q6 hours prn pain, dispense # 20.    [name], MD Electronically signed, 03/04/23

## 2023-03-04 NOTE — Discharge Instructions (Addendum)
[facility] Instructions °Hand Surgery ° °Wound Care: °Keep your hand elevated above the level of your heart.  Do not allow it to dangle by your side.  Keep the dressing dry and do not remove it unless your doctor advises you to do so.  He will usually change it at the time of your post-op visit.  Moving your fingers is advised to stimulate circulation but will depend on the site of your surgery.  If you have a splint applied, your doctor will advise you regarding movement. ° °Activity: °Do not drive or operate machinery today.  Rest today and then you may return to your normal activity and work as indicated by your physician. ° °Diet:  °Drink liquids today or eat a light diet.  You may resume a regular diet tomorrow.   ° °General expectations: °Pain for two to three days. °Fingers may become slightly swollen. ° °Call your doctor if any of the following occur: °Severe pain not relieved by pain medication. °Elevated temperature. °Dressing soaked with blood. °Inability to move fingers. °White or bluish color to fingers. ° ° °Post Anesthesia Home Care Instructions ° °Activity: °Get plenty of rest for the remainder of the day. A responsible individual must stay with you for 24 hours following the procedure.  °For the next 24 hours, DO NOT: °-Drive a car °-Operate machinery °-Drink alcoholic beverages °-Take any medication unless instructed by your physician °-Make any legal decisions or sign important papers. ° °Meals: °Start with liquid foods such as gelatin or soup. Progress to regular foods as tolerated. Avoid greasy, spicy, heavy foods. If nausea and/or vomiting occur, drink only clear liquids until the nausea and/or vomiting subsides. Call your physician if vomiting continues. ° °Special Instructions/Symptoms: °Your throat may feel dry or sore from the anesthesia or the breathing tube placed in your throat during surgery. If this causes discomfort, gargle with warm salt water. The discomfort should disappear within  24 hours. ° °If you had a scopolamine patch placed behind your ear for the management of post- operative nausea and/or vomiting: ° °1. The medication in the patch is effective for 72 hours, after which it should be removed.  Wrap patch in a tissue and discard in the trash. Wash hands thoroughly with soap and water. °2. You may remove the patch earlier than 72 hours if you experience unpleasant side effects which may include dry mouth, dizziness or visual disturbances. °3. Avoid touching the patch. Wash your hands with soap and water after contact with the patch. °  ° ° °

## 2023-03-04 NOTE — Anesthesia Preprocedure Evaluation (Addendum)
Anesthesia Evaluation  Patient identified by MRN, date of birth, ID band Patient awake    Reviewed: Allergy & Precautions, NPO status , Patient's Chart, lab work & pertinent test results  Airway Mallampati: II  TM Distance: >3 FB Neck ROM: Full    Dental no notable dental hx. (+) Teeth Intact, Dental Advisory Given   Pulmonary neg pulmonary ROS   Pulmonary exam normal breath sounds clear to auscultation       Cardiovascular hypertension, + CAD and + Past MI  Normal cardiovascular exam Rhythm:Regular Rate:Normal     Neuro/Psych negative neurological ROS  negative psych ROS   GI/Hepatic negative GI ROS, Neg liver ROS,,,  Endo/Other  negative endocrine ROS    Renal/GU negative Renal ROS  negative genitourinary   Musculoskeletal negative musculoskeletal ROS (+)    Abdominal   Peds negative pediatric ROS (+)  Hematology negative hematology ROS (+)   Anesthesia Other Findings   Reproductive/Obstetrics negative OB ROS                             Anesthesia Physical Anesthesia Plan  ASA: III  Anesthesia Plan: General   Post-op Pain Management:    Induction: Intravenous  PONV Risk Score and Plan: 0 and 2 and Ondansetron, Dexamethasone and Treatment may vary due to age or medical condition  Airway Management Planned: LMA  Additional Equipment: None  Intra-op Plan:   Post-operative Plan: Extubation in OR  Informed Consent: I have reviewed the patients History and Physical, chart, labs and discussed the procedure including the risks, benefits and alternatives for the proposed anesthesia with the patient or authorized representative who has indicated his/her understanding and acceptance.     Dental advisory given  Plan Discussed with: CRNA, Surgeon and Anesthesiologist  Anesthesia Plan Comments: ( )        Anesthesia Quick Evaluation

## 2023-03-04 NOTE — Anesthesia Postprocedure Evaluation (Signed)
Anesthesia Post Note  Patient: Mario [name]  Procedure(s) Performed: RIGHT REPEAT CARPAL TUNNEL RELEASE (Right)     Anesthesia Type: General Anesthetic complications: no   No notable events documented.  Last Vitals:  Vitals:   03/04/23 1030 03/04/23 1037  BP: (!) 144/78 135/66  Pulse: 83   Resp: 12 16  Temp:  36.7 C  SpO2: 95% 95%    Last Pain:  Vitals:   03/04/23 1037  TempSrc:   PainSc: 0-No pain                 Joshlyn Beadle

## 2023-03-04 NOTE — Transfer of Care (Addendum)
Immediate Anesthesia Transfer of Care Note  Patient: Mario [name]  Procedure(s) Performed: RIGHT REPEAT CARPAL TUNNEL RELEASE (Right)  Patient Location: PACU  Anesthesia Type:General  Level of Consciousness: drowsy  Airway & Oxygen Therapy: Patient Spontanous Breathing and Patient connected to face mask oxygen  Post-op Assessment: Report given to RN and Post -op Vital signs reviewed and stable  Post vital signs: Reviewed and stable  Last Vitals:  Vitals Value Taken Time  BP 131/72 03/04/23 0955  Temp    Pulse 77 03/04/23 0957  Resp 9 03/04/23 0957  SpO2 98 % 03/04/23 0957  Vitals shown include unfiled device data.  Last Pain:  Vitals:   03/04/23 0720  TempSrc: Temporal  PainSc: 0-No pain      Patients Stated Pain Goal: 1 (03/04/23 0720)  Complications: No notable events documented.

## 2023-03-04 NOTE — Op Note (Signed)
I assisted Surgeons and Role:    * [name], MD - Primary    Cindee Salt, MD - Assisting on the Procedure(s): RIGHT REPEAT CARPAL TUNNEL RELEASE on 03/04/2023.  I provided assistance on this case as follows: Set up, approach, retraction for identification and release of the median nerve closure of the wound and application of the dressing.  Electronically signed by: Cindee Salt, MD Date: 03/04/2023 Time: 9:48 AM

## 2023-03-04 NOTE — H&P (Signed)
Mario [name] is an 77 y.o. male.   Chief Complaint: carpal tunnel syndrome HPI: 77 yo male with recurrent carpal tunnel syndrome right hand.  Numbness and tingling in hand.  Nocturnal symptoms.  Positive nerve conduction studies.  He wishes to proceed with repeat surgical release.  Allergies:  Allergies  Allergen Reactions   Nsaids Other (See Comments)    "Cardiologist recommended not to take over a long period of time"   Zicam Cold Remedy [Homeopathic Products] Other (See Comments)    Unknown reaction   Atorvastatin Calcium     Other Reaction(s): muscles heavy and weak   Sulfa Antibiotics Rash   Sulfasalazine Rash    Past Medical History:  Diagnosis Date   Coronary artery disease    Essential hypertension, benign    Glaucoma    Myocardial infarct [facility])    Old myocardial infarction     Past Surgical History:  Procedure Laterality Date   CARPAL TUNNEL RELEASE Left 03/16/2017   Procedure: LEFT CARPAL TUNNEL RELEASE;  Surgeon: Cindee Salt, MD;  Location: [facility];  Service: Orthopedics;  Laterality: Left;   CORONARY STENT PLACEMENT     rotator cuff surgery Bilateral     Family History: Family History  Problem Relation Age of Onset   Lung cancer Mother    CAD Father    CVA Father    Heart attack Father    Hypertension Brother    Heart attack Brother     Social History:   reports that he has never smoked. He has never used smokeless tobacco. He reports that he does not drink alcohol and does not use drugs.  Medications: Medications Prior to Admission  Medication Sig Dispense Refill   aspirin EC 81 MG tablet Take 1 tablet (81 mg total) by mouth daily. 90 tablet 3   ibuprofen (ADVIL) 200 MG tablet Take 200 mg by mouth every 6 (six) hours as needed for moderate pain or headache.     latanoprost (XALATAN) 0.005 % ophthalmic solution Place 1 drop into both eyes at bedtime.     metoprolol tartrate (LOPRESSOR) 25 MG tablet TAKE 1 TABLET BY MOUTH TWICE A  DAY 180 tablet 2   rosuvastatin (CRESTOR) 20 MG tablet TAKE 1 TABLET BY MOUTH EVERY DAY 90 tablet 2   timolol (TIMOPTIC) 0.5 % ophthalmic solution Place 1 drop into both eyes 2 (two) times daily.  1   valsartan (DIOVAN) 160 MG tablet TAKE 1 TABLET BY MOUTH EVERY DAY IN THE MORNING 90 tablet 2   fluticasone (FLONASE) 50 MCG/ACT nasal spray USE 2 SPRAYS AS DIRECTED EVERY DAY 16 mL 6   neomycin-polymyxin-hydrocortisone (CORTISPORIN) 3.5-10000-1 OTIC suspension Place into both ears as directed.     nitroGLYCERIN (NITROSTAT) 0.4 MG SL tablet Place 1 tablet (0.4 mg total) under the tongue every 5 (five) minutes as needed for chest pain (UP TO 3 DOSES BEFORE CALLING 911). 25 tablet 3    No results found for this or any previous visit (from the past 48 hour(s)).  No results found.    Blood pressure (!) 145/83, pulse 73, temperature (!) 97.4 F (36.3 C), temperature source Temporal, resp. rate 20, height 5\' 10"  (1.778 m), weight 88.1 kg, SpO2 100%.  General appearance: alert, cooperative, and appears stated age Head: Normocephalic, without obvious abnormality, atraumatic Neck: supple, symmetrical, trachea midline Extremities: Intact sensation and capillary refill all digits.  +epl/fpl/io.  No wounds.  Pulses: 2+ and symmetric Skin: Skin color, texture, turgor normal. No  rashes or lesions Neurologic: Grossly normal Incision/Wound: none  Assessment/Plan Recurrent carpal tunnel syndrome.  Plan repeat carpal tunnel release.  Non operative and operative treatment options have been discussed with the patient and patient wishes to proceed with operative treatment. Risks, benefits, and alternatives of surgery have been discussed and the patient agrees with the plan of care.   [name] 03/04/2023, 7:44 AM

## 2023-03-05 ENCOUNTER — Encounter (HOSPITAL_BASED_OUTPATIENT_CLINIC_OR_DEPARTMENT_OTHER): Payer: Self-pay | Admitting: Orthopedic Surgery

## 2023-08-03 IMAGING — CT CT HEAD W/O CM
4 series · 16 of 47 positions shown, 18 images · non-contrast
Comparison: None.

CLINICAL DATA: Head trauma.

EXAM:
CT HEAD WITHOUT CONTRAST
CT MAXILLOFACIAL WITHOUT CONTRAST
TECHNIQUE: Multidetector CT imaging of the head and maxillofacial structures
were performed using the standard protocol without intravenous
contrast. Multiplanar CT image reconstructions of the maxillofacial
structures were also generated.

[Series 2: head wo · axial · 0.47mm/px · z∈[-290,-170]mm · 7 of 34 slices shown, 9 images]
[im 5/34  brain]
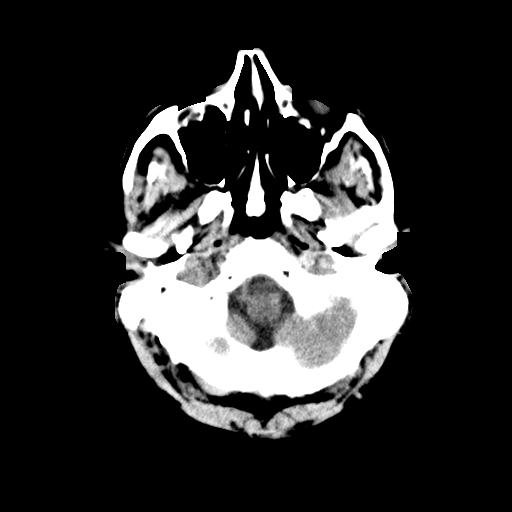
[im 5/34  bone]
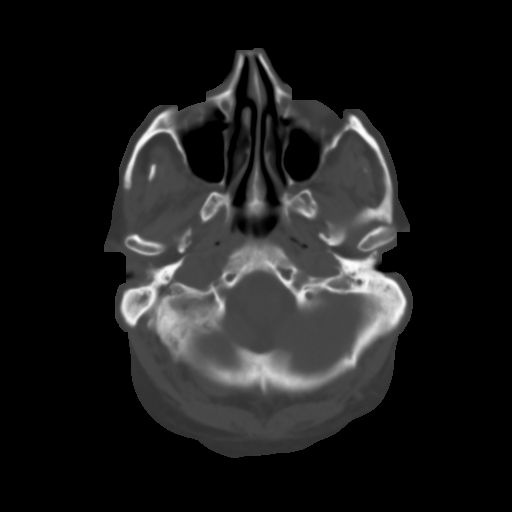
[im 9/34  brain]
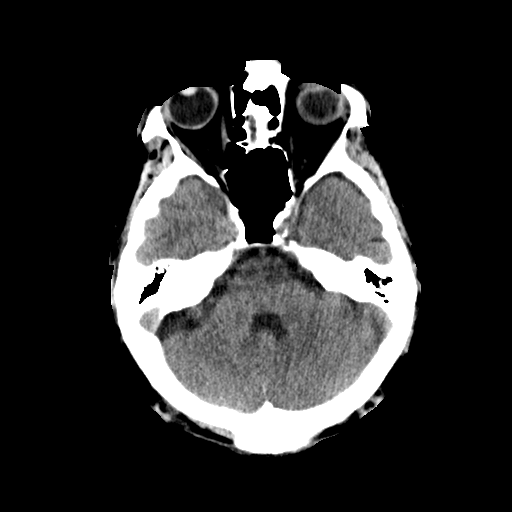
[im 13/34  brain]
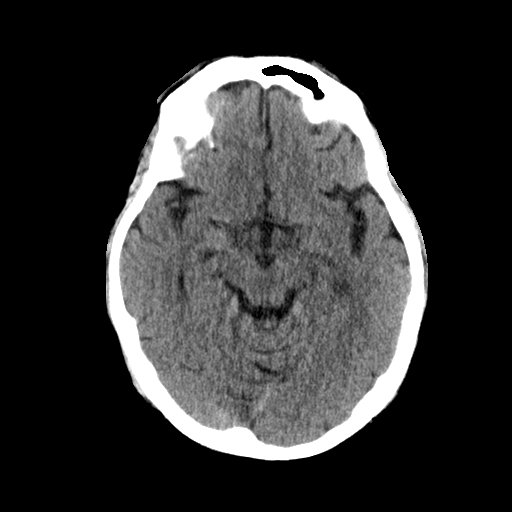
[im 17/34  brain]
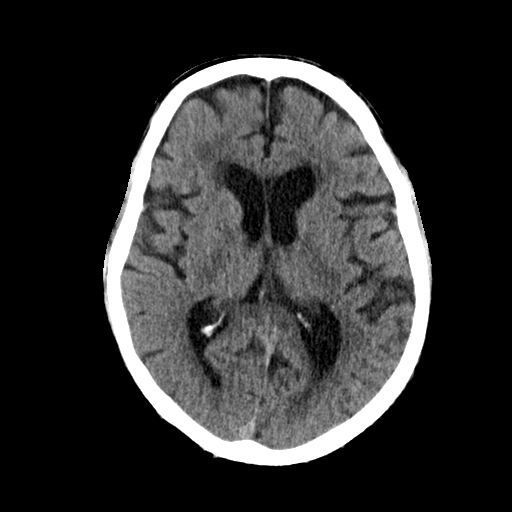
[im 21/34  brain]
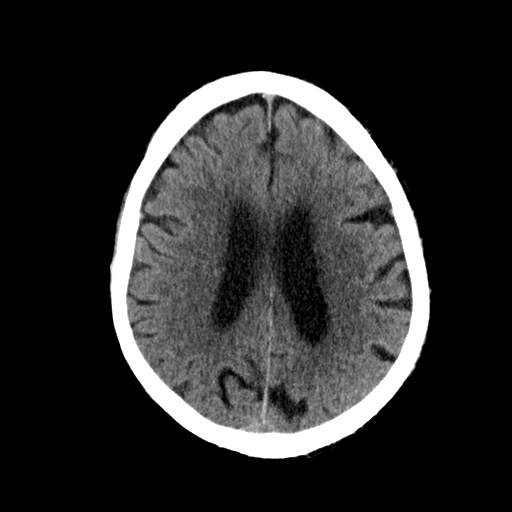
[im 21/34  bone]
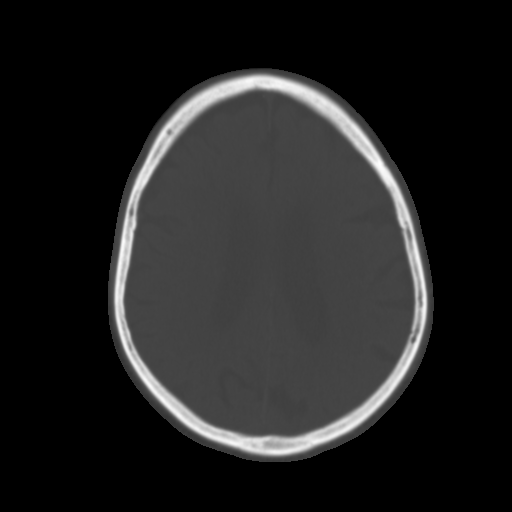
[im 25/34  brain]
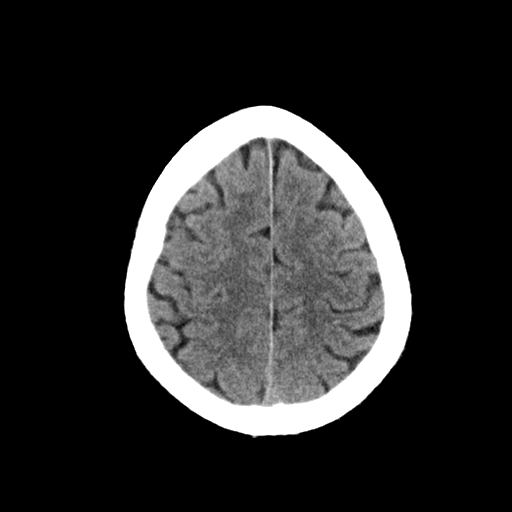
[im 29/34  brain]
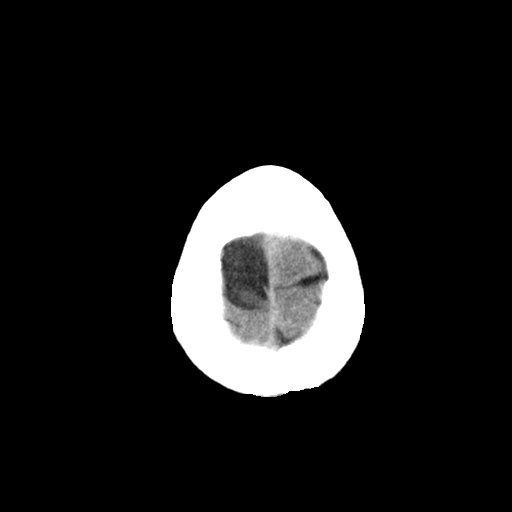

[Series 3: head bone · axial · 0.47mm/px · z∈[-294,-262]mm · 3 of 84 slices shown]
[im 9/84  bone]
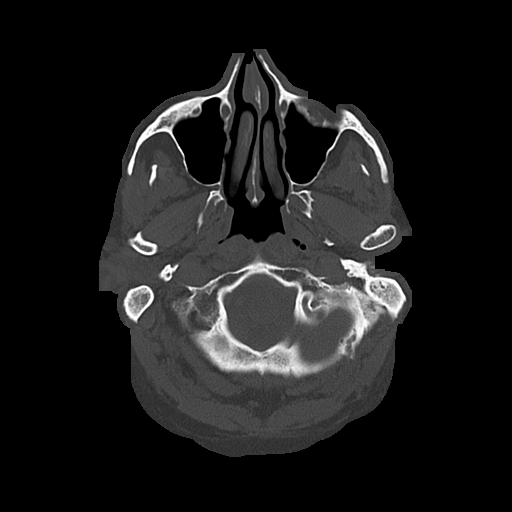
[im 17/84  bone]
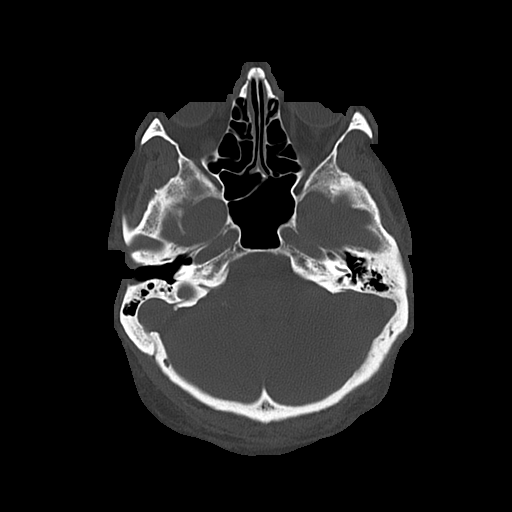
[im 25/84  bone]
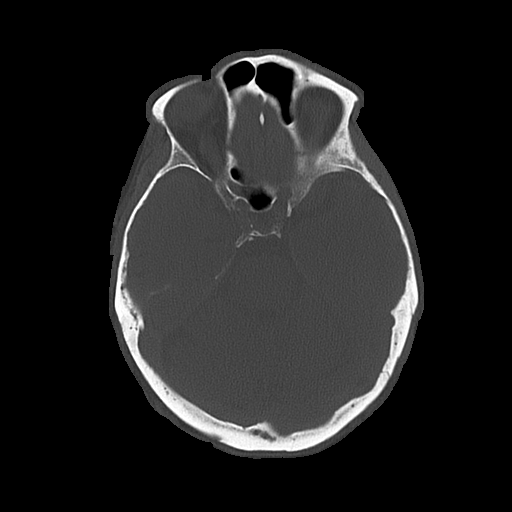

[Series 4: coronal soft · coronal · 0.34mm/px · 3 of 78 slices shown]
[im 26/78  brain]
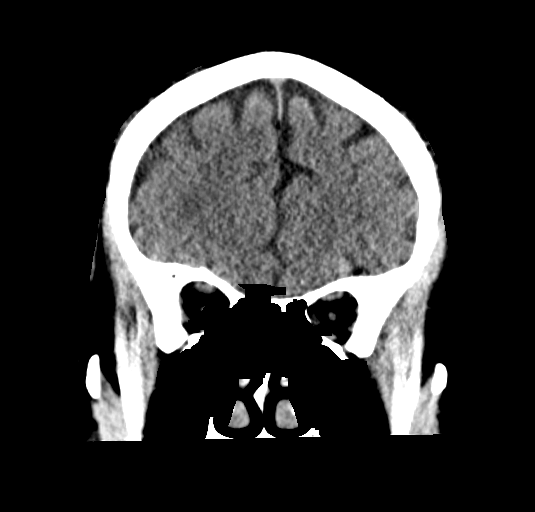
[im 35/78  brain]
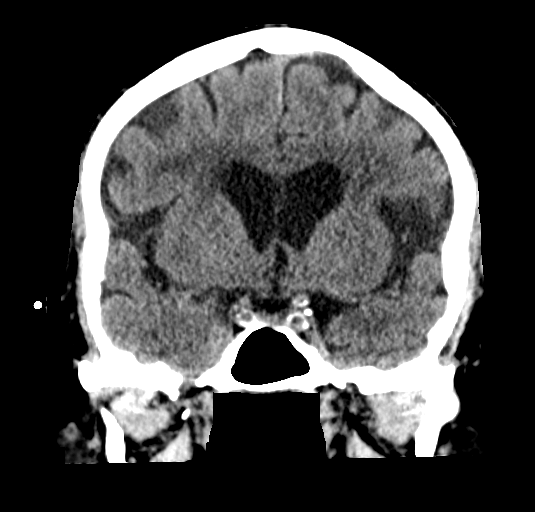
[im 43/78  brain]
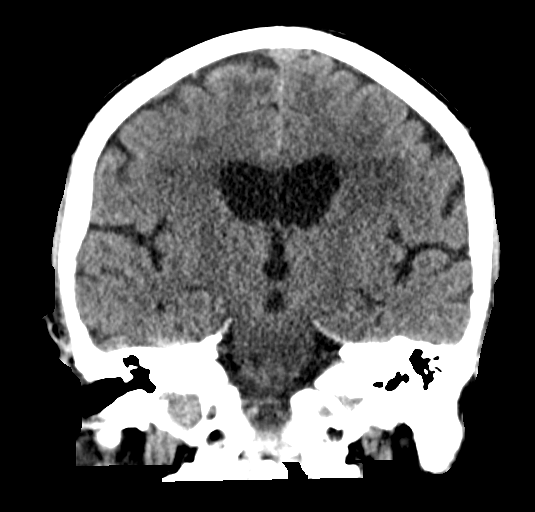

[Series 5: sagittal soft · sagittal · 0.34mm/px · 3 of 62 slices shown]
[im 21/62  brain]
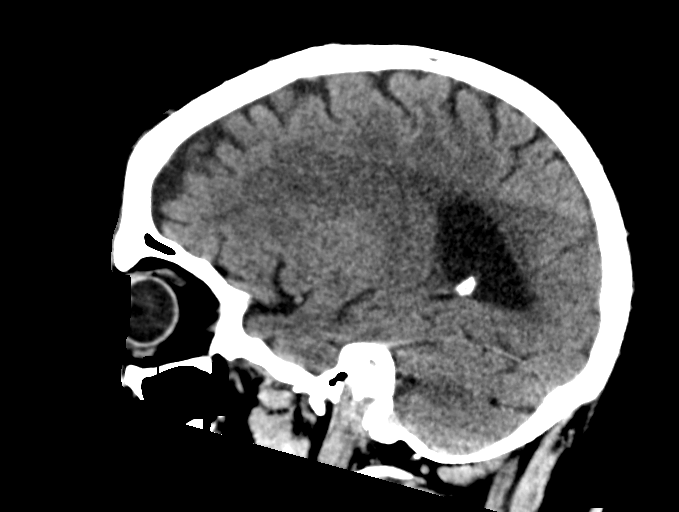
[im 31/62  brain]
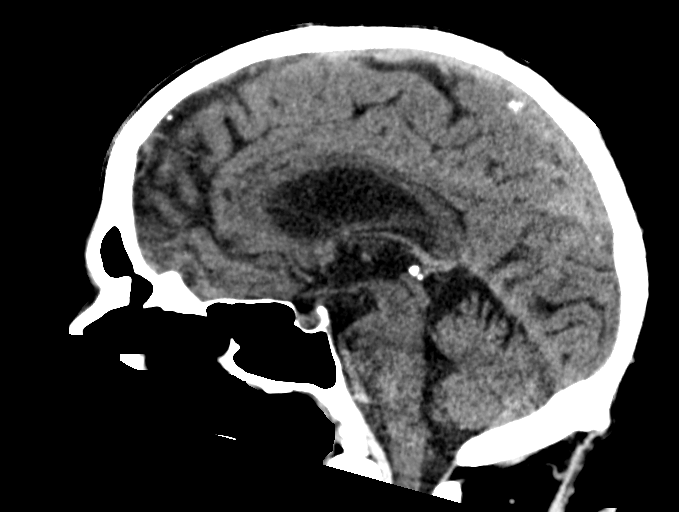
[im 41/62  brain]
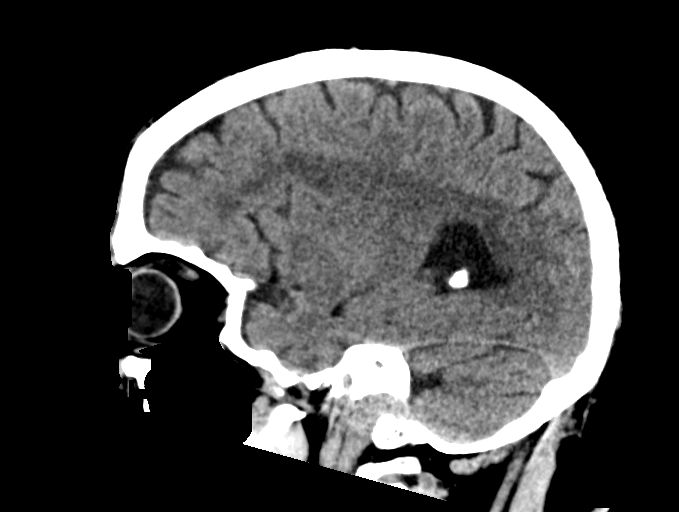

[16 of 47 positions shown; findings below may reference images not displayed]

FINDINGS: CT HEAD FINDINGS

Brain: Mild chronic ischemic white matter disease is noted. No mass
effect or midline shift is noted. Ventricular size is within normal
limits. There is no evidence of mass lesion, hemorrhage or acute
infarction.

Vascular: No hyperdense vessel or unexpected calcification.

Skull: Normal. Negative for fracture or focal lesion.

Other: None.

CT MAXILLOFACIAL FINDINGS

Osseous: No fracture or mandibular dislocation. No destructive
process.

Orbits: Negative. No traumatic or inflammatory finding.

Sinuses: Clear.

Soft tissues: Negative.
IMPRESSION: No acute intracranial abnormality seen.

No definite abnormality seen in maxillofacial region.

## 2023-08-03 IMAGING — CT CT MAXILLOFACIAL W/O CM
3 series · 16 of 47 positions shown, 19 images · non-contrast
Comparison: None.

CLINICAL DATA: Head trauma.

EXAM:
CT HEAD WITHOUT CONTRAST
CT MAXILLOFACIAL WITHOUT CONTRAST
TECHNIQUE: Multidetector CT imaging of the head and maxillofacial structures
were performed using the standard protocol without intravenous
contrast. Multiplanar CT image reconstructions of the maxillofacial
structures were also generated.

[Series 2: 1 max soft · axial · 0.37mm/px · z∈[-404,-254]mm · 10 of 87 slices shown, 13 images]
[im 6/87  brain]
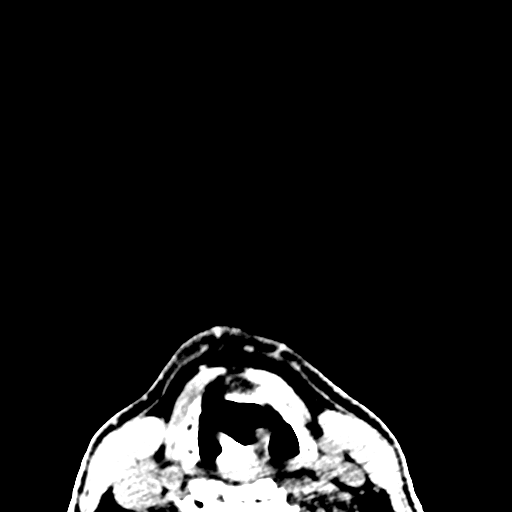
[im 6/87  bone]
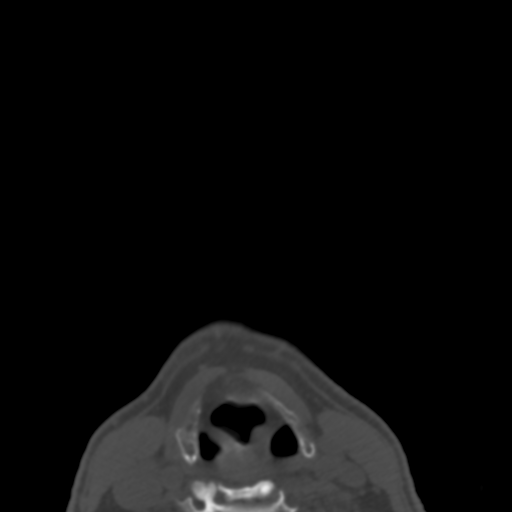
[im 15/87  bone]
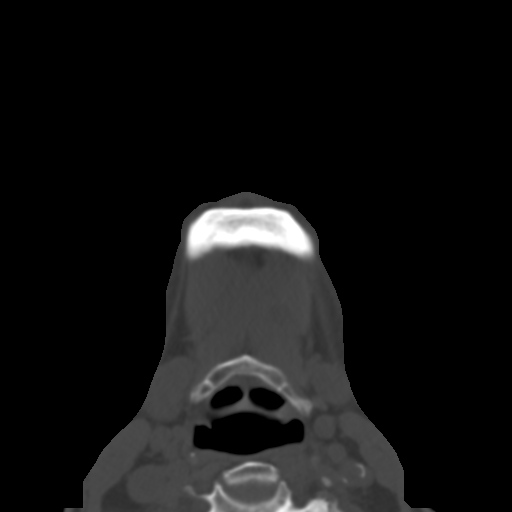
[im 24/87  bone]
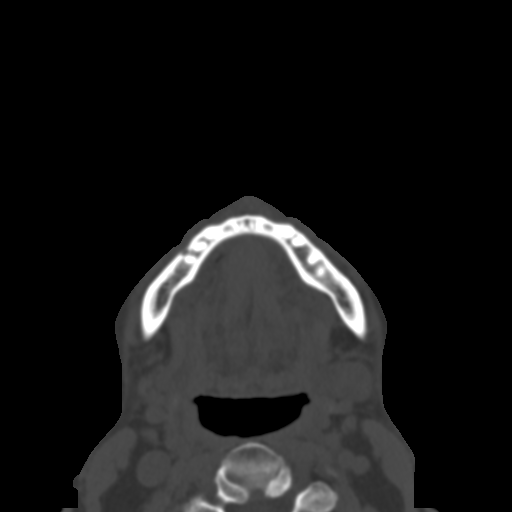
[im 30/87  bone]
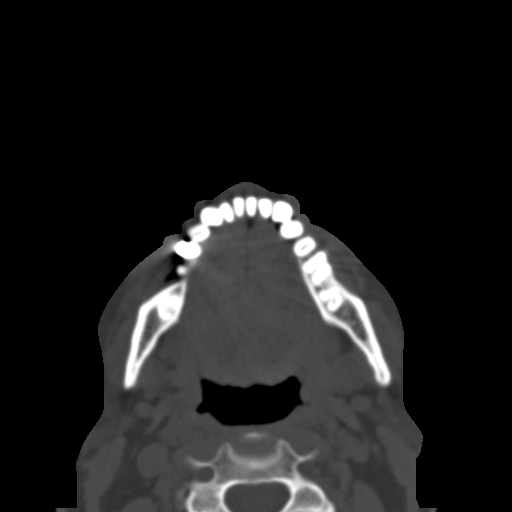
[im 39/87  brain]
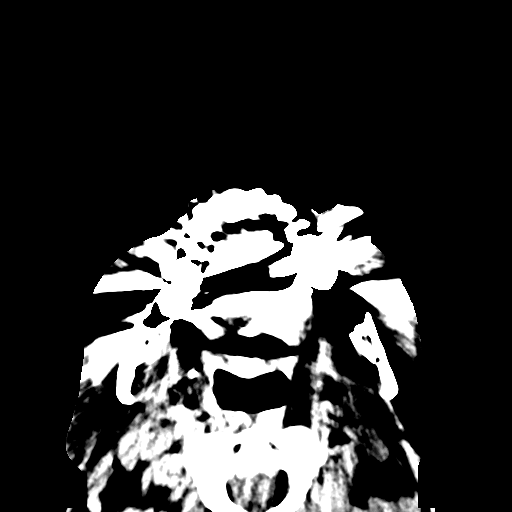
[im 39/87  bone]
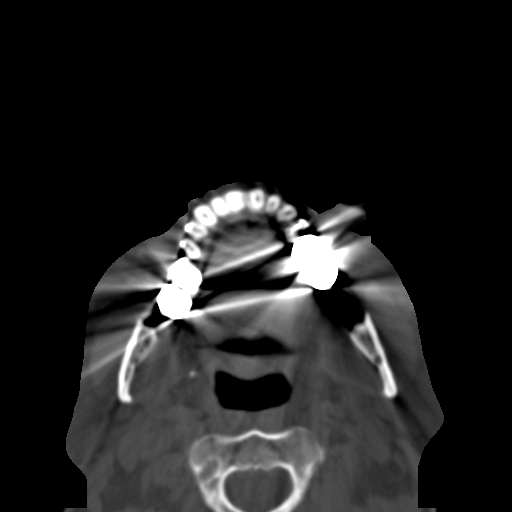
[im 48/87  bone]
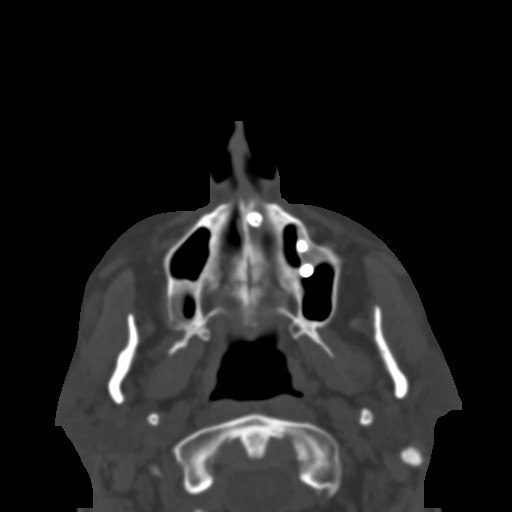
[im 57/87  bone]
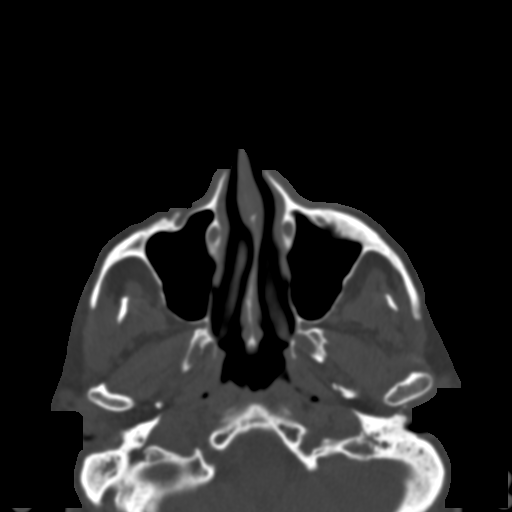
[im 66/87  bone]
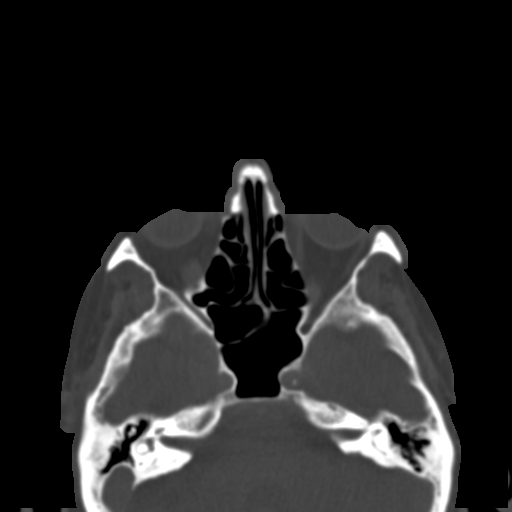
[im 72/87  brain]
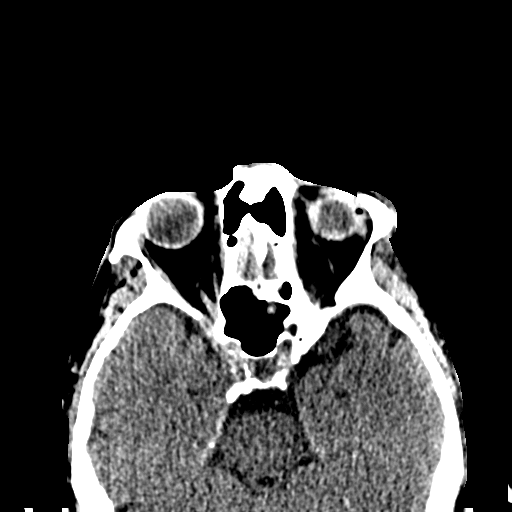
[im 72/87  bone]
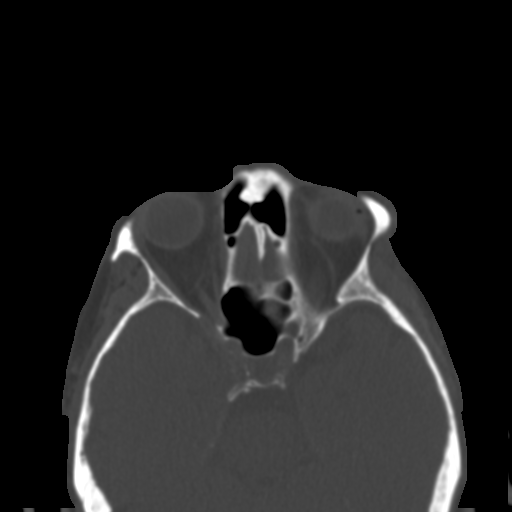
[im 81/87  bone]
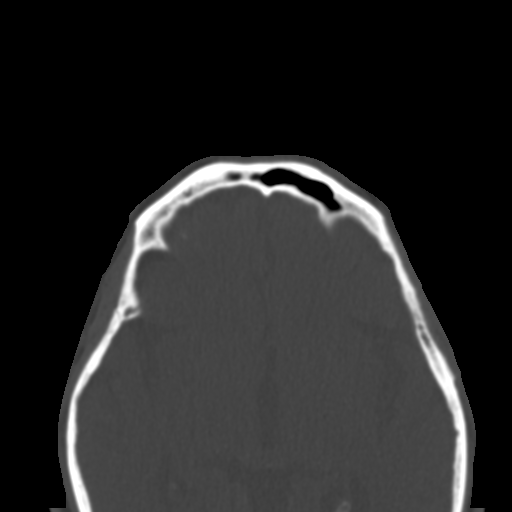

[Series 6: coronal soft (person_name) · coronal · 0.39mm/px · 3 of 82 slices shown]
[im 28/82  bone]
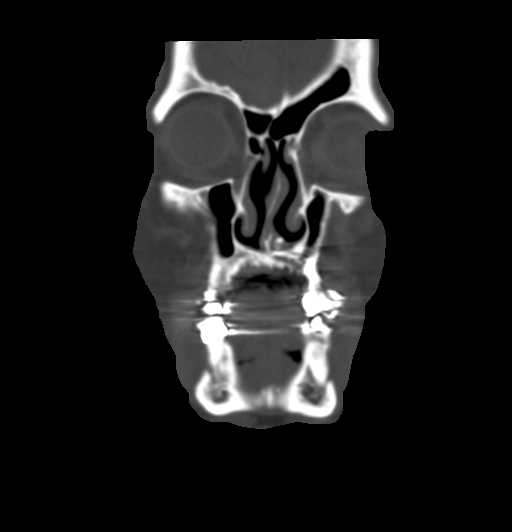
[im 37/82  bone]
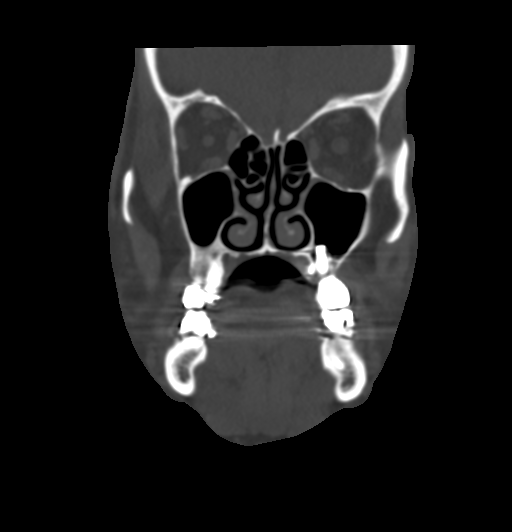
[im 46/82  bone]
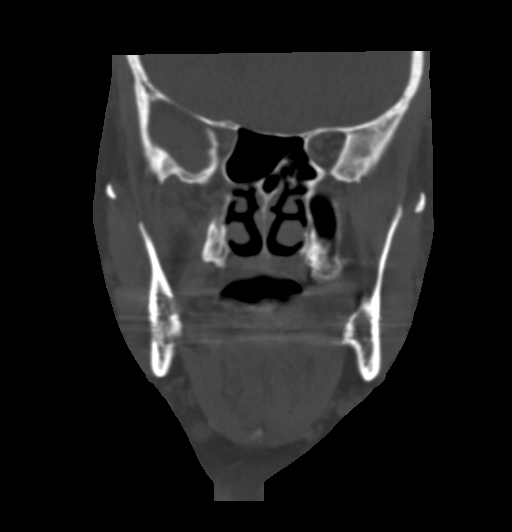

[Series 8: sagittal soft (person_name) · sagittal · 0.32mm/px · 3 of 101 slices shown]
[im 34/101  bone]
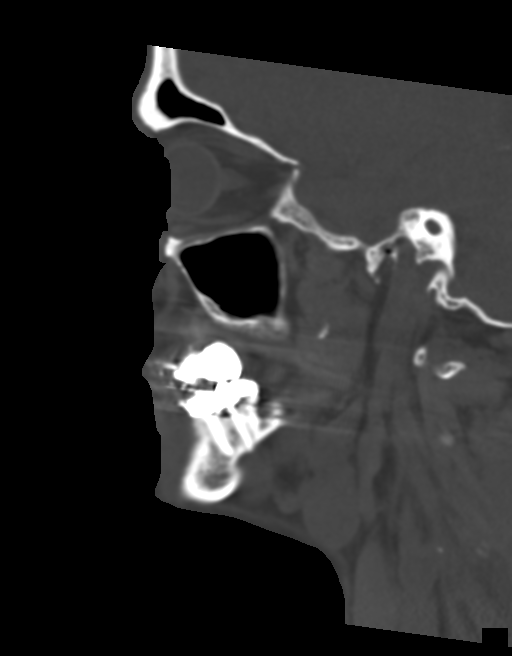
[im 51/101  bone]
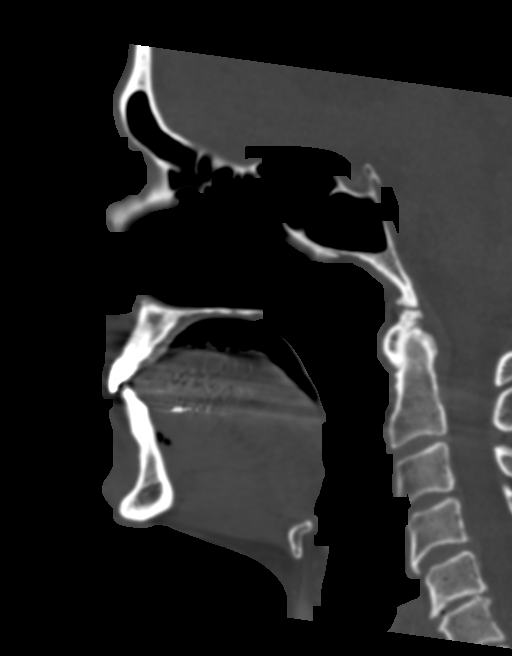
[im 67/101  bone]
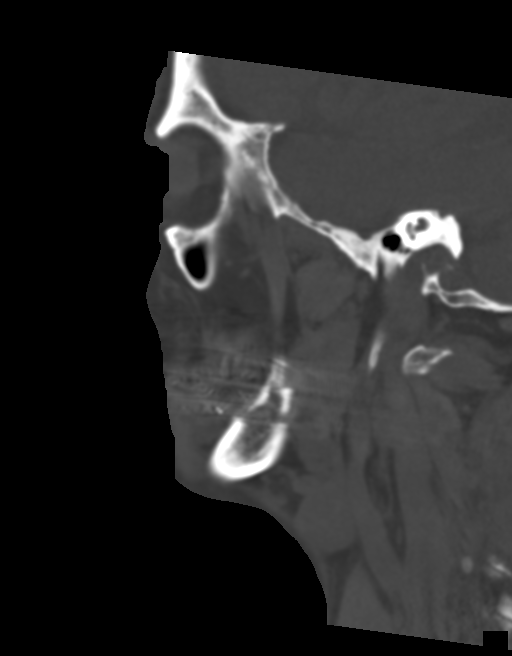

[16 of 47 positions shown; findings below may reference images not displayed]

FINDINGS: CT HEAD FINDINGS

Brain: Mild chronic ischemic white matter disease is noted. No mass
effect or midline shift is noted. Ventricular size is within normal
limits. There is no evidence of mass lesion, hemorrhage or acute
infarction.

Vascular: No hyperdense vessel or unexpected calcification.

Skull: Normal. Negative for fracture or focal lesion.

Other: None.

CT MAXILLOFACIAL FINDINGS

Osseous: No fracture or mandibular dislocation. No destructive
process.

Orbits: Negative. No traumatic or inflammatory finding.

Sinuses: Clear.

Soft tissues: Negative.
IMPRESSION: No acute intracranial abnormality seen.

No definite abnormality seen in maxillofacial region.

## 2023-08-10 ENCOUNTER — Other Ambulatory Visit: Payer: Self-pay | Admitting: Interventional Cardiology

## 2023-09-02 ENCOUNTER — Other Ambulatory Visit: Payer: Self-pay | Admitting: Interventional Cardiology

## 2023-09-02 ENCOUNTER — Encounter: Payer: Self-pay | Admitting: Interventional Cardiology

## 2023-09-04 ENCOUNTER — Other Ambulatory Visit: Payer: Self-pay | Admitting: Interventional Cardiology

## 2023-09-06 NOTE — Progress Notes (Unsigned)
  Cardiology Office Note:   Date:  09/06/2023  ID:  Mario [name], DOB [birth year], MRN [id number] PCP: Lysle Saunas, MD (Inactive)  [facility] Providers Cardiologist:  Avery Bodo, MD {  History of Present Illness:   Mario [name] is a 78 y.o. male who had an anterior MI, and BMS to the LAD in 2007.  He had a cardiac cath in 2012 showing a patent LAD stent. Negative stress test in 2014.  ***     ***  In the past, felt fatigue on metoprolol .  He takes a nap in the evening and feels better.    He had some subjective muscle loss/weakness on atorvastatin .  We switched to Crestor  and he felt better.    Received COVID shots.    Reported some symptoms occurring in April 2024: "having intermittent chest discomfort that lasts seconds at a time - not currently experiencing. Denies SOB, radiating, n/v, or dizziness. Patient would like to be evaluated by provider "   Last week, he had to change a tire for his sister-in-law.  He was trying to loosen tight lugnuts.  He strained very heavily.  A few days later, he started having some very brief sensations in the left lower back.  These feelings have persisted, but getting less frequent.  Had some shoulder pain after this time as well.  More at rest.  Less likely with exertion.     ROS: ***  Studies Reviewed:    EKG:       ***  Risk Assessment/Calculations:   {Does this patient have ATRIAL FIBRILLATION?:[phone]} No BP recorded.  {Refresh Note OR Click here to enter BP  :1}***        Physical Exam:   VS:  There were no vitals taken for this visit.   Wt Readings from Last 3 Encounters:  03/04/23 194 lb 3.6 oz (88.1 kg)  08/03/22 195 lb (88.5 kg)  07/27/22 195 lb (88.5 kg)     GEN: Well nourished, well developed in no acute distress NECK: No JVD; No carotid bruits CARDIAC: ***RR, *** murmurs, rubs, gallops RESPIRATORY:  Clear to auscultation without rales, wheezing or rhonchi  ABDOMEN: Soft, non-tender,  non-distended EXTREMITIES:  No edema; No deformity   ASSESSMENT AND PLAN:   ***  CAD/Old MI: ***  Last ischemic evaluation was in 2014.  Symptoms described today are not like his prior cardiac event.  We discussed options including cath versus stress test.  ECG is unchanged.  He does have some baseline ST depressions but they have been present for years.  Will check troponin.  If it is elevated, you will have to go to the hospital with plan for cath tomorrow.  If it is negative, we can plan for a stress test since it has been many years since his last ischemic evaluation.  Hyperlipidemia:  ***   LDL 46 in October 2023.  Continue rosuvastatin .  Hypertension:   ***  Blood pressure mildly increased today.  Continue valsartan .  Will see some home readings prior to making any medication changes.      Follow up ***  Signed, Eilleen Grates, MD

## 2023-09-08 ENCOUNTER — Ambulatory Visit: Attending: Cardiology | Admitting: Cardiology

## 2023-09-08 ENCOUNTER — Encounter: Payer: Self-pay | Admitting: Cardiology

## 2023-09-08 VITALS — BP 146/64 | HR 81 | Ht 70.0 in | Wt 200.0 lb

## 2023-09-08 DIAGNOSIS — R011 Cardiac murmur, unspecified: Secondary | ICD-10-CM

## 2023-09-08 DIAGNOSIS — E785 Hyperlipidemia, unspecified: Secondary | ICD-10-CM | POA: Diagnosis not present

## 2023-09-08 DIAGNOSIS — I10 Essential (primary) hypertension: Secondary | ICD-10-CM

## 2023-09-08 DIAGNOSIS — I25119 Atherosclerotic heart disease of native coronary artery with unspecified angina pectoris: Secondary | ICD-10-CM | POA: Diagnosis not present

## 2023-09-08 MED ORDER — ROSUVASTATIN CALCIUM 20 MG PO TABS: 20.0000 mg | ORAL_TABLET | Freq: Every day | ORAL | 3 refills | Status: DC

## 2023-09-08 MED ORDER — METOPROLOL TARTRATE 25 MG PO TABS: 25.0000 mg | ORAL_TABLET | Freq: Two times a day (BID) | ORAL | 3 refills | Status: DC

## 2023-09-08 MED ORDER — VALSARTAN 160 MG PO TABS: ORAL_TABLET | ORAL | 3 refills | Status: AC

## 2023-09-08 NOTE — Patient Instructions (Signed)
 Medication Instructions:  Your physician recommends that you continue on your current medications as directed. Please refer to the Current Medication list given to you today.  *If you need a refill on your cardiac medications before your next appointment, please call your pharmacy*  Lab Work: Fasting lipid panel, Lpa when you come for echo If you have labs (blood work) drawn today and your tests are completely normal, you will receive your results only by: MyChart Message (if you have MyChart) OR A paper copy in the mail If you have any lab test that is abnormal or we need to change your treatment, we will call you to review the results.  Testing/Procedures: Echocardiogram Your physician has requested that you have an echocardiogram. Echocardiography is a painless test that uses sound waves to create images of your heart. It provides your doctor with information about the size and shape of your heart and how well your heart's chambers and valves are working. This procedure takes approximately one hour. There are no restrictions for this procedure. Please do NOT wear cologne, perfume, aftershave, or lotions (deodorant is allowed). Please arrive 15 minutes prior to your appointment time.  Please note: We ask at that you not bring children with you during ultrasound (echo/ vascular) testing. Due to room size and safety concerns, children are not allowed in the ultrasound rooms during exams. Our front office staff cannot provide observation of children in our lobby area while testing is being conducted. An adult accompanying a patient to their appointment will only be allowed in the ultrasound room at the discretion of the ultrasound technician under special circumstances. We apologize for any inconvenience.   Follow-Up: At [facility], you and your health needs are our priority.  As part of our continuing mission to provide you with exceptional heart care, our providers are all part of  one team.  This team includes your primary Cardiologist (physician) and Advanced Practice Providers or APPs (Physician Assistants and Nurse Practitioners) who all work together to provide you with the care you need, when you need it.  Your next appointment:   1 year(s)  Provider:   Lavonne Prairie, MD  We recommend signing up for the patient portal called MyChart.  Sign up information is provided on this After Visit Summary.  MyChart is used to connect with patients for Virtual Visits (Telemedicine).  Patients are able to view lab/test results, encounter notes, upcoming appointments, etc.  Non-urgent messages can be sent to your provider as well.   To learn more about what you can do with MyChart, go to [email or web address].

## 2023-09-09 ENCOUNTER — Ambulatory Visit: Admitting: Cardiology

## 2023-10-22 ENCOUNTER — Ambulatory Visit (HOSPITAL_COMMUNITY)
Admission: RE | Admit: 2023-10-22 | Discharge: 2023-10-22 | Disposition: A | Discharge status: Home / Self Care | Source: Ambulatory Visit | Attending: Cardiology | Admitting: Cardiology

## 2023-10-22 DIAGNOSIS — R011 Cardiac murmur, unspecified: Secondary | ICD-10-CM | POA: Insufficient documentation

## 2023-10-22 LAB — ECHOCARDIOGRAM COMPLETE
Area-P 1/2: 3.65 cm2
S' Lateral: 2.5 cm

## 2023-10-23 LAB — LIPOPROTEIN A (LPA): Lipoprotein (a): <8.4 nmol/L (ref <75.0)

## 2023-10-23 LAB — LIPID PANEL
Chol/HDL Ratio: 4.2 ratio (ref 0.0–5.0)
Cholesterol, Total: 146 mg/dL (ref 100–199)
HDL: 35 mg/dL — ABNORMAL LOW (ref >39)
LDL Chol Calc (NIH): 81 mg/dL (ref 0–99)
Triglycerides: 174 mg/dL — ABNORMAL HIGH (ref 0–149)
VLDL Cholesterol Cal: 30 mg/dL (ref 5–40)

## 2023-10-24 ENCOUNTER — Ambulatory Visit: Payer: Self-pay | Admitting: Cardiology

## 2023-10-24 DIAGNOSIS — E785 Hyperlipidemia, unspecified: Secondary | ICD-10-CM

## 2023-10-28 NOTE — Telephone Encounter (Signed)
 Spoke with pt regarding results and Dr. Denver suggestions. Pt aware to increase cholesterol medication and get labs in 3 months. Will check with Dr. Lavona regarding Lipitor vs Crestor  before ordering meds as pt is on Crestor  not Lipitor. Labs ordered and release. Pt verbalized understanding. All questions if any were answered.

## 2023-10-28 NOTE — Telephone Encounter (Signed)
-----   Message from Lynwood Schilling sent at 10/24/2023  8:20 AM EDT ----- LDL is not at target.  Increase his Lipitor to 40 mg PO daily and repeat a lipid profile in 3 months. Call Mr. Stencel with the results and send results to Charlott Dorn LABOR, MD  ----- Message ----- From: Rebecka Memos Lab Results In Sent: 10/23/2023   6:36 AM EDT To: Lynwood Schilling, MD

## 2023-11-01 NOTE — Telephone Encounter (Signed)
-----   Message from Lynwood Schilling sent at 10/24/2023  8:20 AM EDT ----- LDL is not at target.  Increase his Lipitor to 40 mg PO daily and repeat a lipid profile in 3 months. Call Mario [name] with the results and send results to Charlott Dorn LABOR, MD  ----- Message ----- From: Rebecka Memos Lab Results In Sent: 10/23/2023   6:36 AM EDT To: Lynwood Schilling, MD

## 2023-11-01 NOTE — Telephone Encounter (Signed)
 Left voice message to call back 8/4

## 2023-11-05 MED ORDER — ROSUVASTATIN CALCIUM 40 MG PO TABS
40.0000 mg | ORAL_TABLET | Freq: Every day | ORAL | 3 refills | Status: AC
Start: 2023-11-05 — End: ?

## 2023-11-05 NOTE — Telephone Encounter (Signed)
 Prescription for Crestor  40 mg once daily sent to pt's pharmacy.

## 2023-11-05 NOTE — Addendum Note (Signed)
 Addended by: Tekeshia Klahr N on: 11/05/2023 11:13 AM   Modules accepted: Orders

## 2023-11-05 NOTE — Telephone Encounter (Signed)
-----   Message from Lynwood Schilling sent at 10/24/2023  8:20 AM EDT ----- LDL is not at target.  Increase his Lipitor to 40 mg PO daily and repeat a lipid profile in 3 months. Call Mr. Stencel with the results and send results to Charlott Dorn LABOR, MD  ----- Message ----- From: Rebecka Memos Lab Results In Sent: 10/23/2023   6:36 AM EDT To: Lynwood Schilling, MD

## 2024-03-08 ENCOUNTER — Other Ambulatory Visit: Payer: Self-pay | Admitting: Cardiology
